# Patient Record
Sex: Male | Born: 1965
Health system: Southern US, Community
[De-identification: ages and names within clinical notes are randomized; demographics above are authoritative.]

## PROBLEM LIST (undated history)

## (undated) DIAGNOSIS — K219 Gastro-esophageal reflux disease without esophagitis: Secondary | ICD-10-CM

## (undated) DIAGNOSIS — I10 Essential (primary) hypertension: Secondary | ICD-10-CM

## (undated) DIAGNOSIS — C61 Malignant neoplasm of prostate: Secondary | ICD-10-CM

## (undated) DIAGNOSIS — G4730 Sleep apnea, unspecified: Secondary | ICD-10-CM

## (undated) DIAGNOSIS — R079 Chest pain, unspecified: Secondary | ICD-10-CM

## (undated) DIAGNOSIS — E785 Hyperlipidemia, unspecified: Secondary | ICD-10-CM

## (undated) HISTORY — PX: COLONOSCOPY: SHX174

## (undated) HISTORY — DX: Chest pain, unspecified: R07.9

## (undated) HISTORY — PX: PROSTATE BIOPSY: SHX241

---

## 1998-11-06 ENCOUNTER — Encounter: Payer: Self-pay | Admitting: Emergency Medicine

## 1998-11-06 ENCOUNTER — Emergency Department (HOSPITAL_COMMUNITY): Admission: EM | Admit: 1998-11-06 | Discharge: 1998-11-06 | Payer: Self-pay | Admitting: Emergency Medicine

## 1998-12-10 ENCOUNTER — Emergency Department (HOSPITAL_COMMUNITY): Admission: EM | Admit: 1998-12-10 | Discharge: 1998-12-10 | Payer: Self-pay | Admitting: Emergency Medicine

## 2004-05-18 ENCOUNTER — Emergency Department (HOSPITAL_COMMUNITY): Admission: EM | Admit: 2004-05-18 | Discharge: 2004-05-18 | Payer: Self-pay | Admitting: Emergency Medicine

## 2006-05-25 ENCOUNTER — Emergency Department (HOSPITAL_COMMUNITY): Admission: EM | Admit: 2006-05-25 | Discharge: 2006-05-26 | Payer: Self-pay | Admitting: Emergency Medicine

## 2007-02-19 ENCOUNTER — Encounter: Admission: RE | Admit: 2007-02-19 | Discharge: 2007-02-19 | Payer: Self-pay | Admitting: Family Medicine

## 2009-01-11 ENCOUNTER — Emergency Department (HOSPITAL_BASED_OUTPATIENT_CLINIC_OR_DEPARTMENT_OTHER): Admission: EM | Admit: 2009-01-11 | Discharge: 2009-01-11 | Payer: Self-pay | Admitting: Emergency Medicine

## 2013-02-08 ENCOUNTER — Emergency Department (INDEPENDENT_AMBULATORY_CARE_PROVIDER_SITE_OTHER)
Admission: EM | Admit: 2013-02-08 | Discharge: 2013-02-08 | Disposition: A | Discharge status: Home / Self Care | Payer: Worker's Compensation | Source: Home / Self Care | Attending: Family Medicine | Admitting: Family Medicine

## 2013-02-08 ENCOUNTER — Emergency Department (INDEPENDENT_AMBULATORY_CARE_PROVIDER_SITE_OTHER): Payer: Worker's Compensation

## 2013-02-08 ENCOUNTER — Encounter (HOSPITAL_COMMUNITY): Payer: Self-pay | Admitting: Emergency Medicine

## 2013-02-08 DIAGNOSIS — W11XXXA Fall on and from ladder, initial encounter: Secondary | ICD-10-CM

## 2013-02-08 DIAGNOSIS — R079 Chest pain, unspecified: Secondary | ICD-10-CM

## 2013-02-08 DIAGNOSIS — M549 Dorsalgia, unspecified: Secondary | ICD-10-CM

## 2013-02-08 MED ORDER — HYDROCODONE-ACETAMINOPHEN 5-325 MG PO TABS: 1.0000 | ORAL_TABLET | Freq: Four times a day (QID) | ORAL | Status: DC | PRN

## 2013-02-08 NOTE — Discharge Instructions (Signed)
Ice pack for bruises as needed and pain medicine as needed, activity as tolerated, return if further problems or go to ER for eval.

## 2013-02-08 NOTE — ED Notes (Signed)
Pt  Fell off  A  Ladder  About  6  Feet  Landed  On  Concrete   And  The  Ladder  -  He  Reports  Pain l  Shoulder  l     Elbow  And  r  Thigh  He  Reports  Pain on  Weight  Bearing  He  Did  Not black out  He  Is  Awake  And  Alert and  Sitting upright on  Exam table    Speaking in  Complete  sentances

## 2013-02-08 NOTE — ED Provider Notes (Signed)
CSN: [id number]     Arrival date & time 02/08/13  1339 History   First MD Initiated Contact with Patient 02/08/13 1612     Chief Complaint  Patient presents with  . Fall   (Consider location/radiation/quality/duration/timing/severity/associated sxs/prior Treatment) Patient is a 48 y.o. male presenting with fall. The history is provided by the patient.  Fall This is a new problem. The current episode started 6 to 12 hours ago (fell 5-6 ' when ladder broke, no head injury or abd pain, c/o left scapular and right thigh pain, ). The problem has not changed since onset.Pertinent negatives include no chest pain, no abdominal pain and no headaches.    History reviewed. No pertinent past medical history. History reviewed. No pertinent past surgical history. History reviewed. No pertinent family history. History  Substance Use Topics  . Smoking status: Never Smoker   . Smokeless tobacco: Not on file  . Alcohol Use: Yes     Comment: occasonally    Review of Systems  Constitutional: Negative.   HENT: Negative.   Respiratory: Negative.   Cardiovascular: Negative for chest pain.  Gastrointestinal: Negative.  Negative for abdominal pain.  Genitourinary: Negative.   Musculoskeletal: Positive for back pain and gait problem. Negative for joint swelling and neck pain.  Skin: Negative.  Negative for wound.  Neurological: Negative for headaches.    Allergies  Review of patient's allergies indicates no known allergies.  Home Medications   Current Outpatient Rx  Name  Route  Sig  Dispense  Refill  . HYDROcodone-acetaminophen (NORCO/VICODIN) 5-325 MG per tablet   Oral   Take 1 tablet by mouth every 6 (six) hours as needed for moderate pain.   30 tablet   0    BP 125/77  Pulse 61  Temp(Src) 97.3 F (36.3 C) (Oral)  Resp 16  SpO2 96% Physical Exam  Nursing note and vitals reviewed. Constitutional: He is oriented to person, place, and time. He appears well-developed and well-nourished.   HENT:  Head: Normocephalic and atraumatic.  Mouth/Throat: Oropharynx is clear and moist.  Eyes: Conjunctivae are normal. Pupils are equal, round, and reactive to light.  Neck: Normal range of motion. Neck supple.  Cardiovascular: Normal rate, regular rhythm, normal heart sounds and intact distal pulses.   Pulmonary/Chest: Effort normal and breath sounds normal. He exhibits no tenderness.  Abdominal: Soft. Bowel sounds are normal. There is no tenderness.  Musculoskeletal: He exhibits tenderness.       Left shoulder: He exhibits tenderness. He exhibits normal range of motion, no bony tenderness, no swelling, no spasm, normal pulse and normal strength.       Back:       Legs: Lymphadenopathy:    He has no cervical adenopathy.  Neurological: He is alert and oriented to person, place, and time. No cranial nerve deficit.  Skin: Skin is warm and dry.    ED Course  Procedures (including critical care time) Labs Review Labs Reviewed - No data to display Imaging Review Dg Ribs Unilateral W/chest Left  02/08/2013   CLINICAL DATA:  History of fall complaining of pain in the left posterior ribs.  EXAM: LEFT RIBS AND CHEST - 3+ VIEW  COMPARISON:  Chest x-ray 05/25/2006.  FINDINGS: Lung volumes are normal. No consolidative airspace disease. No pleural effusions. No pneumothorax. No pulmonary nodule or mass noted. Pulmonary vasculature and the cardiomediastinal silhouette are within normal limits.  Dedicated views of the left ribs demonstrate no acute displaced left-sided rib fractures.  IMPRESSION: 1. No  evidence of significant acute traumatic injury to the thorax.   Electronically Signed   By: Vinnie Langton M.D.   On: 02/08/2013 17:34   Dg Femur Right  02/08/2013   CLINICAL DATA:  Fall wall off ladder.  Right femur pain.  EXAM: RIGHT FEMUR - 2 VIEW  COMPARISON:  None.  FINDINGS: There is no evidence of fracture or other focal bone lesions. Soft tissues are unremarkable.  IMPRESSION: Negative.    Electronically Signed   By: Earle Gell M.D.   On: 02/08/2013 17:37    EKG Interpretation    Date/Time:    Ventricular Rate:    PR Interval:    QRS Duration:   QT Interval:    QTC Calculation:   R Axis:     Text Interpretation:              MDM  X-rays reviewed and report per radiologist.     Billy Fischer, MD 02/08/13 [phone]

## 2015-03-06 ENCOUNTER — Emergency Department (HOSPITAL_BASED_OUTPATIENT_CLINIC_OR_DEPARTMENT_OTHER)
Admission: EM | Admit: 2015-03-06 | Discharge: 2015-03-06 | Disposition: A | Discharge status: Home / Self Care | Payer: Commercial Managed Care - HMO | Attending: Emergency Medicine | Admitting: Emergency Medicine

## 2015-03-06 ENCOUNTER — Emergency Department (HOSPITAL_BASED_OUTPATIENT_CLINIC_OR_DEPARTMENT_OTHER): Payer: Commercial Managed Care - HMO

## 2015-03-06 ENCOUNTER — Encounter (HOSPITAL_BASED_OUTPATIENT_CLINIC_OR_DEPARTMENT_OTHER): Payer: Self-pay | Admitting: *Deleted

## 2015-03-06 DIAGNOSIS — R61 Generalized hyperhidrosis: Secondary | ICD-10-CM | POA: Insufficient documentation

## 2015-03-06 DIAGNOSIS — R200 Anesthesia of skin: Secondary | ICD-10-CM | POA: Diagnosis not present

## 2015-03-06 DIAGNOSIS — R079 Chest pain, unspecified: Secondary | ICD-10-CM | POA: Insufficient documentation

## 2015-03-06 DIAGNOSIS — R0602 Shortness of breath: Secondary | ICD-10-CM | POA: Insufficient documentation

## 2015-03-06 DIAGNOSIS — R202 Paresthesia of skin: Secondary | ICD-10-CM | POA: Insufficient documentation

## 2015-03-06 LAB — D-DIMER, QUANTITATIVE: D-Dimer, Quant: <0.27 ug/mL-FEU (ref 0.00–0.50)

## 2015-03-06 LAB — CBC
HCT: 42.4 % (ref 39.0–52.0)
Hemoglobin: 14.3 g/dL (ref 13.0–17.0)
MCH: 28.7 pg (ref 26.0–34.0)
MCHC: 33.7 g/dL (ref 30.0–36.0)
MCV: 85.1 fL (ref 78.0–100.0)
PLATELETS: 330 10*3/uL (ref 150–400)
RBC: 4.98 MIL/uL (ref 4.22–5.81)
RDW: 12.9 % (ref 11.5–15.5)
WBC: 11.1 10*3/uL — ABNORMAL HIGH (ref 4.0–10.5)

## 2015-03-06 LAB — BASIC METABOLIC PANEL
Anion gap: 9 (ref 5–15)
BUN: 17 mg/dL (ref 6–20)
CALCIUM: 8.8 mg/dL — AB (ref 8.9–10.3)
CO2: 25 mmol/L (ref 22–32)
Chloride: 105 mmol/L (ref 101–111)
Creatinine, Ser: 0.93 mg/dL (ref 0.61–1.24)
GFR calc non Af Amer: >60 mL/min (ref >60)
Glucose, Bld: 112 mg/dL — ABNORMAL HIGH (ref 65–99)
Potassium: 3.5 mmol/L (ref 3.5–5.1)
SODIUM: 139 mmol/L (ref 135–145)

## 2015-03-06 LAB — TROPONIN I: Troponin I: <0.03 ng/mL (ref <0.031)

## 2015-03-06 MED ORDER — ACETAMINOPHEN 500 MG PO TABS
1000.0000 mg | ORAL_TABLET | Freq: Once | ORAL | Status: AC
  Administered 2015-03-06: 1000 mg via ORAL
  Filled 2015-03-06: qty 2

## 2015-03-06 MED ORDER — NITROGLYCERIN 0.4 MG SL SUBL
0.4000 mg | SUBLINGUAL_TABLET | SUBLINGUAL | Status: DC | PRN
  Administered 2015-03-06 (×2): 0.4 mg via SUBLINGUAL
  Filled 2015-03-06: qty 1

## 2015-03-06 NOTE — ED Provider Notes (Signed)
CSN: [id number]     Arrival date & time 03/06/15  1721 History  By signing my name below, I, Altamease Oiler, attest that this documentation has been prepared under the direction and in the presence of Alfonzo Beers, MD. Electronically Signed: Altamease Oiler, ED Scribe. 03/06/2015. 5:47 PM    Chief Complaint  Patient presents with  . Chest Pain    Patient is a 50 y.o. male presenting with chest pain. The history is provided by the patient. No language interpreter was used.  Chest Pain Pain location:  Substernal area and L chest Pain quality: sharp and tightness   Pain radiates to:  Does not radiate Pain radiates to the back: no   Pain severity:  Severe Onset quality:  Gradual Duration:  2 weeks Timing:  Intermittent Progression:  Worsening Chronicity:  New Context: breathing and movement   Relieved by:  Nothing Worsened by:  Movement and deep breathing Ineffective treatments:  None tried Associated symptoms: shortness of breath   Associated symptoms: no cough, no lower extremity edema, no nausea and not vomiting   Risk factors: male sex   Risk factors: no coronary artery disease, no diabetes mellitus, no high cholesterol and no hypertension    Samuel Gonzalez is a 50 y.o. male who presents to the Emergency Department complaining of new, intermittent, sharp/tight, 8/10 in severity, cental and left-sided chest pain with onset 2 weeks ago. The pain worsened today and has been more constant. The pain has no know trigger but deep breathing and movement make it worse. He took no pain medication at home.   Associated symptoms include sweating, numbness and tingling in the bilateral upper extremities (R>L), and SOB. Pt denies nausea and LE swelling. No history of cardiac disease, HTN, DM, or high cholesterol. No recent long ravel.  He is allergic to aspirin.   History reviewed. No pertinent past medical history. History reviewed. No pertinent past surgical history. No family history on  file. Social History  Substance Use Topics  . Smoking status: Never Smoker   . Smokeless tobacco: None  . Alcohol Use: Yes     Comment: occasonally    Review of Systems  Respiratory: Positive for shortness of breath. Negative for cough.   Cardiovascular: Positive for chest pain.  Gastrointestinal: Negative for nausea and vomiting.  All other systems reviewed and are negative.  Allergies  Aspirin  Home Medications   Prior to Admission medications   Medication Sig Start Date End Date Taking? Authorizing Provider  HYDROcodone-acetaminophen (NORCO/VICODIN) 5-325 MG per tablet Take 1 tablet by mouth every 6 (six) hours as needed for moderate pain. 02/08/13   Billy Fischer, MD   BP 147/78 mmHg  Pulse 61  Temp(Src) 97.8 F (36.6 C) (Oral)  Resp 18  Ht 5\' 10"  (1.778 m)  Wt 260 lb (117.935 kg)  BMI 37.31 kg/m2  SpO2 97% Vitals reviewed Physical Exam  Physical Examination: General appearance - alert, well appearing, and in no distress Mental status - alert, oriented to person, place, and time Eyes - no conjunctival injection, no scleral icterus Mouth - mucous membranes moist, pharynx normal without lesions Chest - clear to auscultation, no wheezes, rales or rhonchi, symmetric air entry Heart - normal rate, regular rhythm, normal S1, S2, no murmurs, rubs, clicks or gallops Abdomen - soft, nontender, nondistended, no masses or organomegaly Neurological - alert, oriented, normal speech, strength 5/5 in extremities x 4, sensation intact Extremities - peripheral pulses normal, no pedal edema, no clubbing or cyanosis  Skin - normal coloration and turgor, no rashes  ED Course  Procedures (including critical care time) DIAGNOSTIC STUDIES: Oxygen Saturation is 97% on RA,  normal by my interpretation.    COORDINATION OF CARE: 5:43 PM Discussed treatment plan which includes lab work, CXR, EKG with pt at bedside and pt agreed to plan.  Labs Review Labs Reviewed  BASIC METABOLIC PANEL -  Abnormal; Notable for the following:    Glucose, Bld 112 (*)    Calcium 8.8 (*)    All other components within normal limits  CBC - Abnormal; Notable for the following:    WBC 11.1 (*)    All other components within normal limits  TROPONIN I  D-DIMER, QUANTITATIVE (NOT AT [facility])  TROPONIN I    Imaging Review Dg Chest 2 View  03/06/2015  CLINICAL DATA:  Chest tightness ongoing for 2 weeks. EXAM: CHEST  2 VIEW COMPARISON:  02/08/2013. FINDINGS: The lungs are clear wiithout focal pneumonia, edema, pneumothorax or pleural effusion. The cardiopericardial silhouette is within normal limits for size. The visualized bony structures of the thorax are intact. Telemetry leads overlie the chest. IMPRESSION: No active cardiopulmonary disease. Electronically Signed   By: Misty Stanley M.D.   On: 03/06/2015 17:53   I have personally reviewed and evaluated these images and lab results as part of my medical decision-making.   EKG Interpretation   Date/Time:  Saturday March 06 2015 17:29:56 EST Ventricular Rate:  64 PR Interval:  151 QRS Duration: 95 QT Interval:  436 QTC Calculation: 450 R Axis:   42 Text Interpretation:  Sinus rhythm No significant change since last  tracing Confirmed by [facility]  MD, Chaniqua Brisby [phone]) on 03/06/2015 6:02:13 PM      MDM   Final diagnoses:  Chest pain, unspecified chest pain type    Pt presenting with c/o chest pain which has been intermittent over the past 2 weeks and also constant > 8 hours today.  Workup reassuring including EKG, 2 sets of troponin, CXR.  D-dimer checked and negative as well.   He has a heart score of 1. Discussed results with patient and he is agreeable with plan for discharge and outpatient followup.  Discharged with strict return precautions.  Pt agreeable with plan.  I personally performed the services described in this documentation, which was scribed in my presence. The recorded information has been reviewed and is accurate.     Alfonzo Beers, MD 03/06/15 2206

## 2015-03-06 NOTE — ED Notes (Signed)
Patient is alert and oriented x4.  He is complaining of chest tightness that has been ongoing for 2 weeks. Patient adds that he also have been having numbness in both arms.  Currently he is rating his discomfort 8 of 10.  He has been having this problem intermittently for 6 months

## 2015-03-06 NOTE — Discharge Instructions (Signed)
Return to the ED with any concerns including worsening pain, difficulty breathing, fainting, vomiting and not able to keep down liquids, decreased level of alertness/lethargy, or any other alarming symptoms

## 2015-03-06 NOTE — ED Notes (Signed)
Per pt report tingling in all finger to lt hand, ache in back of upper arm and in teeth. Feeling HA due to NTG x 2 given. Report chest pressure improved.

## 2015-03-06 NOTE — ED Notes (Signed)
Pt placed on auto vitals Q30.  

## 2015-03-25 ENCOUNTER — Ambulatory Visit (INDEPENDENT_AMBULATORY_CARE_PROVIDER_SITE_OTHER): Payer: Commercial Managed Care - HMO | Admitting: Neurology

## 2015-03-25 ENCOUNTER — Encounter: Payer: Self-pay | Admitting: Neurology

## 2015-03-25 VITALS — BP 125/80 | HR 58 | Resp 18 | Ht 71.0 in | Wt 266.0 lb

## 2015-03-25 DIAGNOSIS — R0683 Snoring: Secondary | ICD-10-CM | POA: Diagnosis not present

## 2015-03-25 DIAGNOSIS — G4719 Other hypersomnia: Secondary | ICD-10-CM

## 2015-03-25 DIAGNOSIS — R079 Chest pain, unspecified: Secondary | ICD-10-CM

## 2015-03-25 DIAGNOSIS — G4726 Circadian rhythm sleep disorder, shift work type: Secondary | ICD-10-CM | POA: Diagnosis not present

## 2015-03-25 DIAGNOSIS — R51 Headache: Secondary | ICD-10-CM

## 2015-03-25 DIAGNOSIS — R519 Headache, unspecified: Secondary | ICD-10-CM

## 2015-03-25 DIAGNOSIS — E669 Obesity, unspecified: Secondary | ICD-10-CM | POA: Diagnosis not present

## 2015-03-25 NOTE — Patient Instructions (Addendum)

## 2015-03-25 NOTE — Progress Notes (Signed)
Subjective:    Patient ID: Samuel Gonzalez is a 50 y.o. male.  HPI     Star Age, MD, PhD [facility] [address]O. [place], [place] [address]   Dear Shawna Orleans and Ulice Dash,   I saw your patient, Samuel Gonzalez, upon your kind request in my neurologic clinic today for initial consultation of his sleep disorder, in particular, concern for underlying obstructive sleep apnea. The patient is unaccompanied today. As you know, Samuel Gonzalez is a 50 year old right-handed gentleman with an underlying medical history of chest pain, shift work, and obesity, who reports snoring and excessive daytime somnolence.   He has worked 3rd shift for years, had a gap for 2 years when he worked days and is back on 3rd shift for the past 6 months. He works in Scient[employer], clinical (histocompatibility and immunogenetics). He works from midnight to 8:30 AM. Bedtime is around 1 to 2 PM and rise time is usually around 8 PM. He lives at home with his wife, one daughter and one granddaughter. He is a nonsmoker. He drinks alcohol infrequently, 2-3 times a month, and caffeine in the form of cola, 2-3 cans per day. He denies restless leg symptoms or nocturia. He denies a family history of OSA. His wife has noted abnormal sounds while he is asleep including snorting. He has occasional morning headaches. He had a cardiac SPECT scan on 03/19/2015 and an echocardiogram on 03/24/2015 with results pending. He has an appointment in your office later on this month. His EKG on 03/11/2015 was reportedly normal per your records. He was started on metoprolol and baby aspirin recently. He was also started on atorvastatin. He had blood work through your office with results pending. He was given a prescription for nitroglycerin which he has not used yet. He denies reflux symptoms at night. His Epworth sleepiness score is 10 out of 24 today, his fatigue score is 22 out of 63. I reviewed your office note from 03/11/2015, which you kindly  included.  His Past Medical History Is Significant For: Past Medical History  Diagnosis Date  . Chest pain     His Past Surgical History Is Significant For: No past surgical history on file.  His Family History Is Significant For: Family History  Problem Relation Age of Onset  . Diabetes Mother   . Hypertension Mother   . Heart disease Maternal Grandmother   . Heart disease Maternal Grandfather   . Cancer Paternal Grandmother   . Cancer Paternal Grandfather     His Social History Is Significant For: Social History   Social History  . Marital Status: Married    Spouse Name: N/A  . Number of Children: 2  . Years of Education: [employer]   Social History Main Topics  . Smoking status: Never Smoker   . Smokeless tobacco: None  . Alcohol Use: 0.0 oz/week    0 Standard drinks or equivalent per week  . Drug Use: No  . Sexual Activity: Not Asked   Other Topics Concern  . None   Social History Narrative   Drinks 2-3 cokes a day     His Allergies Are:  Allergies  Allergen Reactions  . Aspirin Hives  :   His Current Medications Are:  Outpatient Encounter Prescriptions as of 03/25/2015  Medication Sig  . aspirin 81 MG tablet Take 81 mg by mouth daily.  Marland Kitchen atorvastatin (LIPITOR) 40 MG tablet TK 1 T PO QD  . metoprolol succinate (TOPROL-XL) 25 MG 24 hr  tablet Take 25 mg by mouth daily.  . nitroGLYCERIN (NITROSTAT) 0.4 MG SL tablet 1 (ONE) TABLET UNDER TONGUE EVERY MINUTES AS NEEDED FOR CHEST PAIN  . [DISCONTINUED] HYDROcodone-acetaminophen (NORCO/VICODIN) 5-325 MG per tablet Take 1 tablet by mouth every 6 (six) hours as needed for moderate pain.   No facility-administered encounter medications on file as of 03/25/2015.  :  Review of Systems:  Out of a complete 14 point review of systems, all are reviewed and negative with the exception of these symptoms as listed below:   Review of Systems  Neurological:       Patient has sleep study 10 years ago, never prescribed  CPAP.  Patient works third shift, sometimes has trouble staying asleep, snoring, wakes up with dry mouth, sometimes gets headaches, sometimes wakes up feeling tired.    Epworth Sleepiness Scale 0= would never doze 1= slight chance of dozing 2= moderate chance of dozing 3= high chance of dozing  Sitting and reading:2 Watching TV:2 Sitting inactive in a public place (ex. Theater or meeting):1 As a passenger in a car for an hour without a break:0 Lying down to rest in the afternoon:3 Sitting and talking to someone:1 Sitting quietly after lunch (no alcohol):1 In a car, while stopped in traffic:0 Total:10   Objective:  Neurologic Exam  Physical Exam Physical Examination:   Filed Vitals:   03/25/15 0855  BP: 125/80  Pulse: 58  Resp: 18    General Examination: The patient is a very pleasant 50 y.o. male in no acute distress. He appears well-developed and well-nourished and well groomed.   HEENT: Normocephalic, atraumatic, pupils are equal, round and reactive to light and accommodation. Funduscopic exam is normal with sharp disc margins noted. Extraocular tracking is good without limitation to gaze excursion or nystagmus noted. Normal smooth pursuit is noted. Hearing is grossly intact. Tympanic membranes are clear bilaterally. Face is symmetric with normal facial animation and normal facial sensation. Speech is clear with no dysarthria noted. There is no hypophonia. There is no lip, neck/head, jaw or voice tremor. Neck is supple with full range of passive and active motion. There are no carotid bruits on auscultation. Oropharynx exam reveals: mild mouth dryness, adequate dental hygiene and marked airway crowding, due to narrow airway entry, redundant and thicker soft palate and large uvula and tonsils of 2+ in place, thicker tongue. Mallampati is class III. Tongue protrudes centrally and palate elevates symmetrically. Tonsils are 2+ in size. Neck size is 18.75 inches. He has a Moderate  overbite.   Chest: Clear to auscultation without wheezing, rhonchi or crackles noted.  Heart: S1+S2+0, regular and normal without murmurs, rubs or gallops noted.   Abdomen: Soft, non-tender and non-distended with normal bowel sounds appreciated on auscultation.  Extremities: There is no pitting edema in the distal lower extremities bilaterally. Pedal pulses are intact.  Skin: Warm and dry without trophic changes noted. There are no varicose veins.  Musculoskeletal: exam reveals no obvious joint deformities, tenderness or joint swelling or erythema.   Neurologically:  Mental status: The patient is awake, alert and oriented in all 4 spheres. His immediate and remote memory, attention, language skills and fund of knowledge are appropriate. There is no evidence of aphasia, agnosia, apraxia or anomia. Speech is clear with normal prosody and enunciation. Thought process is linear. Mood is normal and affect is normal.  Cranial nerves II - XII are as described above under HEENT exam. In addition: shoulder shrug is normal with equal shoulder height noted. Motor  exam: Normal bulk, strength and tone is noted. There is no drift, tremor or rebound. Romberg is negative. Reflexes are 2+ throughout. Babinski: Toes are flexor bilaterally. Fine motor skills and coordination: intact with normal finger taps, normal hand movements, normal rapid alternating patting, normal foot taps and normal foot agility.  Cerebellar testing: No dysmetria or intention tremor on finger to nose testing. Heel to shin is unremarkable bilaterally. There is no truncal or gait ataxia.  Sensory exam: intact to light touch, pinprick, vibration, temperature sense in the upper and lower extremities.  Gait, station and balance: He stands easily. No veering to one side is noted. No leaning to one side is noted. Posture is age-appropriate and stance is narrow based. Gait shows normal stride length and normal pace. No problems turning are noted.  He turns en bloc. Tandem walk is unremarkable.   Assessment and Plan:   In summary, Samuel Gonzalez is a very pleasant 50 y.o.-year old male with an underlying medical history of chest pain, shift work, and obesity, whose history and physical exam are highly concerning for obstructive sleep apnea (OSA). His shift work makes it more difficult for him to maintain adequate sleep. Of note, on Friday nights he tries to sleep at night. We will try to schedule him for a Friday night sleep study if possible. I had a long chat with the patient about my findings and the diagnosis of OSA, its prognosis and treatment options. We talked about medical treatments, surgical interventions and non-pharmacological approaches. I explained in particular the risks and ramifications of untreated moderate to severe OSA, especially with respect to developing cardiovascular disease down the Road, including congestive heart failure, difficult to treat hypertension, cardiac arrhythmias, or stroke. Even type 2 diabetes has, in part, been linked to untreated OSA. Symptoms of untreated OSA include daytime sleepiness, memory problems, mood irritability and mood disorder such as depression and anxiety, lack of energy, as well as recurrent headaches, especially morning headaches. We talked about trying to maintain a healthy lifestyle in general, as well as the importance of weight control. I encouraged the patient to eat healthy, exercise daily and keep well hydrated, to keep a scheduled bedtime and wake time routine, to not skip any meals and eat healthy snacks in between meals. I advised the patient not to drive when feeling sleepy. I recommended the following at this time: sleep study with potential positive airway pressure titration. (We will score hypopneas at 4% and split the sleep study into diagnostic and treatment portion, if the estimated. 2 hour AHI is >20/h).   I explained the sleep test procedure to the patient and also outlined  possible surgical and non-surgical treatment options of OSA, including the use of a custom-made dental device (which would require a referral to a specialist dentist or oral surgeon), upper airway surgical options, such as pillar implants, radiofrequency surgery, tongue base surgery, and UPPP (which would involve a referral to an ENT surgeon). Rarely, jaw surgery such as mandibular advancement may be considered.  I also explained the CPAP treatment option to the patient, who indicated that he would be willing to try CPAP if the need arises. I explained the importance of being compliant with PAP treatment, not only for insurance purposes but primarily to improve His symptoms, and for the patient's long term health benefit, including to reduce His cardiovascular risks. I answered all his questions today and the patient was in agreement. I would like to see him back after the sleep study  is completed and encouraged him to call with any interim questions, concerns, problems or updates.   Thank you very much for allowing me to participate in the care of this nice patient. If I can be of any further assistance to you please do not hesitate to call me at [phone].  Sincerely,   Star Age, MD, PhD

## 2015-04-12 ENCOUNTER — Ambulatory Visit: Payer: Self-pay | Admitting: Cardiovascular Disease

## 2015-04-16 ENCOUNTER — Ambulatory Visit (INDEPENDENT_AMBULATORY_CARE_PROVIDER_SITE_OTHER): Payer: Commercial Managed Care - HMO | Admitting: Neurology

## 2015-04-16 DIAGNOSIS — G4733 Obstructive sleep apnea (adult) (pediatric): Secondary | ICD-10-CM | POA: Diagnosis not present

## 2015-04-16 DIAGNOSIS — G479 Sleep disorder, unspecified: Secondary | ICD-10-CM

## 2015-04-16 DIAGNOSIS — G4761 Periodic limb movement disorder: Secondary | ICD-10-CM

## 2015-04-16 DIAGNOSIS — G4734 Idiopathic sleep related nonobstructive alveolar hypoventilation: Secondary | ICD-10-CM

## 2015-04-16 NOTE — Sleep Study (Signed)
Please see the scanned sleep study interpretation located in the procedure tab within the chart review section.   

## 2015-04-18 DIAGNOSIS — R079 Chest pain, unspecified: Secondary | ICD-10-CM | POA: Diagnosis present

## 2015-04-18 NOTE — H&P (Signed)
OFFICE VISIT NOTES COPIED TO EPIC FOR DOCUMENTATION  . Samuel Gonzalez 2015-04-12 8:08 AM Location: [facility] Patient #: [id number] DOB: [birth year] Married / Language: Samuel Gonzalez / Race: White Male   History of Present Illness [name] AGNP-C; 2015-04-12 10:23 AM) The patient is a 50 year old male who presents for a follow-up for Chest pain. He presented for evaluation of recurrent episodes of chest pain. He described pain as a squeezing sensation in his left arm and heaviness in his chest. On 03/06/2015, he went to the emergency department after he developed symptoms that would not resolve with rest and was given sublingual nitroglycerin with relief in pain. ACS was ruled out and he was referred back to his PCP for further management. He has continued to have intermittent episodes of chest pain, both at rest and with exertion, however states exertion certainly makes his symptoms worse. Reports associated mild shortness of breath.  He was scheduled for echocardiogram and nuclear stress test and presents today for follow-up. He was also started on metoprolol, atorvastatin, aspirin, and given sublingual nitroglycerin for symptoms suggestive of angina. Since adding medications, his symptoms have improved somewhat but have remained persistent.   Problem List/Past Medical ([name]; 04/12/2015 9:24 AM) Angina pectoris (I20.9) Exercise sestamibi stress test 03/19/2015: 1. The resting electrocardiogram demonstrated normal sinus rhythm, normal resting conduction and no resting arrhythmias. The stress electrocardiogram was normal. PVC noted in recovery. The patient performed treadmill exercise using a Bruce protocol, completing 5:17 minutes. The patient completed an estimated workload of 7.05 METS 85% of the maximum predicted heart rate. Stress symptoms included dyspnea. The stress test was terminated because of fatigue. Decreased exercise tolerence. 2. The left ventricle  appears to be dilated both in rest and stress images at 145 mL. The perfusion imaging study demonstrates a small sized mid to distal anterior and anteroapical thinning without definite evidence of ischemia. Left ventricular systolic function was mildly depressed at 49%. This represents an intermediate risk study due to LV dilatation and mildly reduced LVEF. Clinical correlation recommended Other fatigue (R53.83) Neurology office visit 03/25/2015: Evaluation for sleep apnea; scheduled for sleep study with potential positive airway pressure titration. Obesity (BMI 30-39.9) (E66.9) Dyspnea on exertion (R06.09) Echocardiogram 03/24/2015: Left ventricle cavity is normal in size. Mild concentric hypertrophy of the left ventricle. Normal global wall motion. Calculated EF 66%. Left atrial cavity is mildly dilated. Trace tricuspid regurgitation. No evidence of pulmonary hypertension. Hypercholesteremia (E78.00)  Allergies ([name]; Apr 12, 2015 9:27 AM) Aspirin *ANALGESICS - NonNarcotic* ok on 81mg  ASA  Family History ([name]; 04/12/15 9:28 AM) Mother In stable health. DM, HTN; no heart attacks or strokes; no other known cardiovascular conditions Father In good health. no heart attacks or strokes, no cardiovascular conditions Sister 1 Younger Brother 1 Younger  Social History ([name]; 04/12/15 9:24 AM) Marital status Married. Living Situation Lives with spouse. Number of Children 2. Alcohol Use Occasional alcohol use. Current tobacco use Never smoker.  Past Surgical History ([name]; 2015/04/12 9:24 AM) None02/03/2015  Medication History ([name]; 12-Apr-2015 9:28 AM) Atorvastatin Calcium (40MG  Tablet, 1 Tablet Oral daily, Taken starting 03/16/2015) Active. Aspirin EC Low Dose (81MG  Tablet DR, 1 (one) Tablet Oral daily, Taken starting 03/11/2015) Active. Nitrostat (0.4MG  Tab Sublingual, 1 (one) Tablet Sublingual every minutes as needed for chest  pain, Taken starting 03/11/2015) Active. Metoprolol Succinate ER (25MG  Tablet ER 24HR, 1 (one) Tablet Oral daily, Taken starting 03/11/2015) Active. Medications Reconciled  Diagnostic Studies History ([name]; 2015/04/12 9:25 AM)  Echocardiogram02/15/2017 Left ventricle cavity is normal in size. Mild concentric hypertrophy of the left ventricle. Normal global wall motion. Calculated EF 66%. Left atrial cavity is mildly dilated. Trace tricuspid regurgitation. No evidence of pulmonary hypertension Nuclear stress test02/11/2015 1. The resting electrocardiogram demonstrated normal sinus rhythm, normal resting conduction and no resting arrhythmias. The stress electrocardiogram was normal. PVC noted in recovery. The patient performed treadmill exercise using a Bruce protocol, completing 5:17 minutes. The patient completed an estimated workload of 7.05 METS 85% of the maximum predicted heart rate. Stress symptoms included dyspnea. The stress test was terminated because of fatigue. Decreased exercise tolerence. 2. The left ventricle appears to be dilated both in rest and stress images at 145 mL. The perfusion imaging study demonstrates a small sized mid to distal anterior and anteroapical thinning without definite evidence of ischemia. Left ventricular systolic function was mildly depressed at 49%. This represents an intermediate risk study due to LV dilatation and mildly reduced LVEF. Clinical correlation recommended Labwork 03/11/2015: Total cholesterol 252, triglycerides 224, HDL 32, LDL 160, LDL particle # [id number], LP-IR score 82, TSH 2.95, creatinine 0.91, potassium 4.6, CMP normal 03/06/2015: CBC normal, troponin negative 2, d-dimer negative, creatinine 0.93, BMP normal Sleep Study2007 Treadmill stress test2007 Colonoscopy2015 Chest X-ray01/28/2017 No active cardiopulmonary disease    Review of Systems [facility] Ebony Hail, AGNP-C; 04/02/2015 10:35 AM) General Not Present- Anorexia, Fatigue and  Fever. Respiratory Not Present- Cough, Decreased Exercise Tolerance, Difficulty Breathing on Exertion and Dyspnea. Cardiovascular Present- Chest Pain. Not Present- Claudications, Edema, Orthopnea, Palpitations and Paroxysmal Nocturnal Dyspnea. Gastrointestinal Not Present- Black, Tarry Stool, Change in Bowel Habits and Nausea. Neurological Not Present- Focal Neurological Symptoms and Syncope. Endocrine Not Present- Cold Intolerance, Excessive Sweating, Heat Intolerance and Thyroid Problems. Hematology Not Present- Anemia, Easy Bruising, Petechiae and Prolonged Bleeding.  Vitals ([name]; 04/02/2015 9:31 AM) 04/02/2015 9:25 AM Weight: 272.06 lb Height: 71in Body Surface Area: 2.4 m Body Mass Index: 37.94 kg/m  Pulse: 58 (Regular)  P.OX: 98% (Room air) BP: 132/52 (Sitting, Left Arm, Standard)  General appearance: alert, cooperative, appears stated age and no distress Eyes: negative findings: lids and lashes normal Neck: no adenopathy, no carotid bruit, no JVD, supple, symmetrical, trachea midline and thyroid not enlarged, symmetric, no tenderness/mass/nodules Neck: JVP - normal, carotids 2+= without bruits Resp: clear to auscultation bilaterally Chest wall: no tenderness Cardio: regular rate and rhythm, S1, S2 normal, no murmur, click, rub or gallop GI: soft, non-tender; bowel sounds normal; no masses,  no organomegaly Extremities: extremities normal, atraumatic, no cyanosis or edema Pulses: 2+ and symmetric Skin: Skin color, texture, turgor normal. No rashes or lesions Neurologic: Grossly normal  Assessment & Plan (Bridgette Allison AGNP-C; 04/02/2015 10:34 AM)  Angina pectoris (I20.9) Story: Exercise sestamibi stress test 03/19/2015: 1. The resting electrocardiogram demonstrated normal sinus rhythm, normal resting conduction and no resting arrhythmias. The stress electrocardiogram was normal. PVC noted in recovery. The patient performed treadmill exercise using a Bruce  protocol, completing 5:17 minutes. The patient completed an estimated workload of 7.05 METS 85% of the maximum predicted heart rate. Stress symptoms included dyspnea. The stress test was terminated because of fatigue. Decreased exercise tolerence. 2. The left ventricle appears to be dilated both in rest and stress images at 145 mL. The perfusion imaging study demonstrates a small sized mid to distal anterior and anteroapical thinning without definite evidence of ischemia. Left ventricular systolic function was mildly depressed at 49%. This represents an intermediate risk study due to LV dilatation and mildly reduced LVEF.  Clinical correlation recommended Impression: EKG 03/11/2015; normal sinus rhythm at 60 bpm, normal axis. No evidence of ischemia, normal EKG. Current Plans Started Isosorbide Mononitrate ER 30MG , 1 (one) Tablet daily, #30, 30 days starting 04/02/2015, Ref. x3. Future Plans [id number]: METABOLIC PANEL, BASIC (99991111) - one time 04/12/2015: CBC & PLATELETS (AUTO) [id number]) - one time 04/12/2015: PT (PROTHROMBIN TIME) ([id number]) - one time  Dyspnea on exertion (R06.09) Story: Echocardiogram 03/24/2015: Left ventricle cavity is normal in size. Mild concentric hypertrophy of the left ventricle. Normal global wall motion. Calculated EF 66%. Left atrial cavity is mildly dilated. Trace tricuspid regurgitation. No evidence of pulmonary hypertension.  Other fatigue (R53.83) Story: Neurology office visit 03/25/2015: Evaluation for sleep apnea; scheduled for sleep study with potential positive airway pressure titration.  Mixed hyperlipidemia (E78.2) Current Plans Changed Atorvastatin Calcium 80MG , 1 Tablet daily, #30, 30 days starting 04/02/2015, No Refill. Obesity (BMI 30-39.9) (E66.9)  Current Plans Mechanism of underlying disease process and action of medications discussed with the patient. I discussed primary/secondary prevention and also dietary counseling was done. He presents for follow-up  of echocardiogram and nuclear stress test for symptoms consistent with angina. Echocardiogram revealed only mild LVH with normal LVEF. Nuclear stress test was intermediate risk with anteroapical thinning with LV dilation and reduced poststress EF at 49%. Despite medical therapy, patient has continued to have symptoms. Schedule for cardiac catheterization, and possible angioplasty. We discussed regarding risks, benefits, alternatives to this including CTA and continued medical therapy. Patient wants to proceed. Understands <1-2% risk of death, stroke, MI, urgent CABG, bleeding, infection, renal failure but not limited to these. Due to markedly elevated lipids, I have increased Lipitor to 80 mg. I have also added Imdur 30 mg for improved symptoms control. Follow up after procedure for reevaluation and further recommendations.  *I have discussed this case with Dr. Einar Gip and he personally examined the patient and participated in formulating the plan.*  Addendum Note(Bridgette Allison AGNP-C; 04/14/2015 3:57 PM) 04/14/2015: Creatinine 0.83, potassium 4.2, CBC normal, PT/INR normal  labs stable to proceed with coronary angiogram.  Signed by [name], AGNP-C (04/02/2015 10:35 AM)

## 2015-04-20 ENCOUNTER — Encounter (HOSPITAL_COMMUNITY): Payer: Self-pay | Admitting: Cardiology

## 2015-04-20 ENCOUNTER — Ambulatory Visit (HOSPITAL_COMMUNITY)
Admission: RE | Admit: 2015-04-20 | Discharge: 2015-04-20 | Disposition: A | Discharge status: Home / Self Care | Payer: Commercial Managed Care - HMO | Source: Ambulatory Visit | Attending: Cardiology | Admitting: Cardiology

## 2015-04-20 ENCOUNTER — Encounter (HOSPITAL_COMMUNITY)
Admission: RE | Disposition: A | Discharge status: Home / Self Care | Payer: Self-pay | Source: Ambulatory Visit | Attending: Cardiology

## 2015-04-20 ENCOUNTER — Telehealth: Payer: Self-pay | Admitting: Neurology

## 2015-04-20 DIAGNOSIS — I209 Angina pectoris, unspecified: Secondary | ICD-10-CM | POA: Insufficient documentation

## 2015-04-20 DIAGNOSIS — R079 Chest pain, unspecified: Secondary | ICD-10-CM | POA: Diagnosis present

## 2015-04-20 DIAGNOSIS — R0609 Other forms of dyspnea: Secondary | ICD-10-CM | POA: Diagnosis not present

## 2015-04-20 DIAGNOSIS — Z7982 Long term (current) use of aspirin: Secondary | ICD-10-CM | POA: Diagnosis not present

## 2015-04-20 DIAGNOSIS — R072 Precordial pain: Secondary | ICD-10-CM | POA: Diagnosis present

## 2015-04-20 DIAGNOSIS — G4733 Obstructive sleep apnea (adult) (pediatric): Secondary | ICD-10-CM

## 2015-04-20 DIAGNOSIS — G4734 Idiopathic sleep related nonobstructive alveolar hypoventilation: Secondary | ICD-10-CM

## 2015-04-20 DIAGNOSIS — Z6837 Body mass index (BMI) 37.0-37.9, adult: Secondary | ICD-10-CM | POA: Diagnosis not present

## 2015-04-20 DIAGNOSIS — E785 Hyperlipidemia, unspecified: Secondary | ICD-10-CM | POA: Insufficient documentation

## 2015-04-20 DIAGNOSIS — E782 Mixed hyperlipidemia: Secondary | ICD-10-CM | POA: Diagnosis not present

## 2015-04-20 DIAGNOSIS — G479 Sleep disorder, unspecified: Secondary | ICD-10-CM

## 2015-04-20 DIAGNOSIS — G4761 Periodic limb movement disorder: Secondary | ICD-10-CM

## 2015-04-20 HISTORY — PX: CARDIAC CATHETERIZATION: SHX172

## 2015-04-20 SURGERY — LEFT HEART CATH AND CORONARY ANGIOGRAPHY
Anesthesia: LOCAL

## 2015-04-20 MED ORDER — FENTANYL CITRATE (PF) 100 MCG/2ML IJ SOLN
INTRAMUSCULAR | Status: AC
Start: 2015-04-20 — End: 2015-04-20
  Filled 2015-04-20: qty 2

## 2015-04-20 MED ORDER — VERAPAMIL HCL 2.5 MG/ML IV SOLN
INTRAVENOUS | Status: AC
  Filled 2015-04-20: qty 2

## 2015-04-20 MED ORDER — NITROGLYCERIN 0.4 MG SL SUBL
0.4000 mg | SUBLINGUAL_TABLET | SUBLINGUAL | Status: DC | PRN
  Administered 2015-04-20: 0.4 mg via SUBLINGUAL
  Filled 2015-04-20: qty 25

## 2015-04-20 MED ORDER — LIDOCAINE HCL (PF) 1 % IJ SOLN
INTRAMUSCULAR | Status: DC | PRN
  Administered 2015-04-20: 5 mL

## 2015-04-20 MED ORDER — HEPARIN SODIUM (PORCINE) 1000 UNIT/ML IJ SOLN
INTRAMUSCULAR | Status: DC | PRN
  Administered 2015-04-20: 6000 [IU] via INTRAVENOUS

## 2015-04-20 MED ORDER — SODIUM CHLORIDE 0.9% FLUSH: 3.0000 mL | Freq: Two times a day (BID) | INTRAVENOUS | Status: DC

## 2015-04-20 MED ORDER — SODIUM CHLORIDE 0.9% FLUSH: 3.0000 mL | INTRAVENOUS | Status: DC | PRN

## 2015-04-20 MED ORDER — IOHEXOL 350 MG/ML SOLN
INTRAVENOUS | Status: DC | PRN
  Administered 2015-04-20: 50 mL via INTRA_ARTERIAL

## 2015-04-20 MED ORDER — ASPIRIN 81 MG PO CHEW
CHEWABLE_TABLET | ORAL | Status: AC
  Filled 2015-04-20: qty 1

## 2015-04-20 MED ORDER — NITROGLYCERIN 0.4 MG SL SUBL
SUBLINGUAL_TABLET | SUBLINGUAL | Status: AC
  Filled 2015-04-20: qty 1

## 2015-04-20 MED ORDER — MIDAZOLAM HCL 2 MG/2ML IJ SOLN
INTRAMUSCULAR | Status: AC
  Filled 2015-04-20: qty 2

## 2015-04-20 MED ORDER — NITROGLYCERIN 1 MG/10 ML FOR IR/CATH LAB
INTRA_ARTERIAL | Status: DC | PRN
  Administered 2015-04-20 (×2): 200 ug via INTRACORONARY

## 2015-04-20 MED ORDER — HEPARIN (PORCINE) IN NACL 2-0.9 UNIT/ML-% IJ SOLN
INTRAMUSCULAR | Status: DC | PRN
  Administered 2015-04-20: 10:00:00

## 2015-04-20 MED ORDER — LIDOCAINE HCL (PF) 1 % IJ SOLN
INTRAMUSCULAR | Status: AC
  Filled 2015-04-20: qty 30

## 2015-04-20 MED ORDER — FENTANYL CITRATE (PF) 100 MCG/2ML IJ SOLN
INTRAMUSCULAR | Status: DC | PRN
  Administered 2015-04-20 (×2): 50 ug via INTRAVENOUS

## 2015-04-20 MED ORDER — FAMOTIDINE 40 MG PO TABS: 40.0000 mg | ORAL_TABLET | Freq: Two times a day (BID) | ORAL | Status: DC

## 2015-04-20 MED ORDER — SODIUM CHLORIDE 0.9 % IV SOLN: 250.0000 mL | INTRAVENOUS | Status: DC | PRN

## 2015-04-20 MED ORDER — SODIUM CHLORIDE 0.9 % WEIGHT BASED INFUSION
3.0000 mL/kg/h | INTRAVENOUS | Status: DC
  Administered 2015-04-20: 3 mL/kg/h via INTRAVENOUS

## 2015-04-20 MED ORDER — HEPARIN (PORCINE) IN NACL 2-0.9 UNIT/ML-% IJ SOLN
INTRAMUSCULAR | Status: AC
  Filled 2015-04-20: qty 1000

## 2015-04-20 MED ORDER — SODIUM CHLORIDE 0.9 % WEIGHT BASED INFUSION
1.0000 mL/kg/h | INTRAVENOUS | Status: DC
Start: 2015-04-20 — End: 2015-04-20

## 2015-04-20 MED ORDER — HEPARIN SODIUM (PORCINE) 1000 UNIT/ML IJ SOLN
INTRAMUSCULAR | Status: AC
  Filled 2015-04-20: qty 1

## 2015-04-20 MED ORDER — SODIUM CHLORIDE 0.9 % WEIGHT BASED INFUSION: 1.0000 mL/kg/h | INTRAVENOUS | Status: DC

## 2015-04-20 MED ORDER — ASPIRIN 81 MG PO CHEW
81.0000 mg | CHEWABLE_TABLET | ORAL | Status: AC
  Administered 2015-04-20: 81 mg via ORAL

## 2015-04-20 MED ORDER — VERAPAMIL HCL 2.5 MG/ML IV SOLN
INTRA_ARTERIAL | Status: DC | PRN
  Administered 2015-04-20: 10 mL via INTRA_ARTERIAL

## 2015-04-20 MED ORDER — MIDAZOLAM HCL 2 MG/2ML IJ SOLN
INTRAMUSCULAR | Status: DC | PRN
  Administered 2015-04-20: 2 mg via INTRAVENOUS

## 2015-04-20 SURGICAL SUPPLY — 8 items
CATH OPTITORQUE JACKY 4.0 5F (CATHETERS) ×1 IMPLANT
DEVICE RAD COMP TR BAND LRG (VASCULAR PRODUCTS) ×2 IMPLANT
GLIDESHEATH SLEND A-KIT 6F 20G (SHEATH) ×1 IMPLANT
KIT HEART LEFT (KITS) ×2 IMPLANT
PACK CARDIAC CATHETERIZATION (CUSTOM PROCEDURE TRAY) ×2 IMPLANT
TRANSDUCER W/STOPCOCK (MISCELLANEOUS) ×2 IMPLANT
TUBING CIL FLEX 10 FLL-RA (TUBING) ×2 IMPLANT
WIRE SAFE-T 1.5MM-J .035X260CM (WIRE) ×1 IMPLANT

## 2015-04-20 NOTE — Telephone Encounter (Signed)
Patient referred by Dr. Einar Gip, seen by me on 03/25/15, diagnostic PSG on 04/16/15, ins: [employer].   Please call and notify the patient that the recent sleep study did confirm the diagnosis of obstructive sleep apnea and that I recommend treatment for this in the form of CPAP. This will require a repeat sleep study for proper titration and mask fitting. Please explain to patient and arrange for a CPAP titration study. I have placed an order in the chart. Thanks, and please route to [facility] for scheduling next sleep study.  Star Age, MD, PhD [facility])

## 2015-04-20 NOTE — Discharge Instructions (Signed)
Angiogram, Care After °Refer to this sheet in the next few weeks. These instructions provide you with information about caring for yourself after your procedure. Your health care provider may also give you more specific instructions. Your treatment has been planned according to current medical practices, but problems sometimes occur. Call your health care provider if you have any problems or questions after your procedure. °WHAT TO EXPECT AFTER THE PROCEDURE °After your procedure, it is typical to have the following: °· Bruising at the catheter insertion site that usually fades within 1-2 weeks. °· Blood collecting in the tissue (hematoma) that may be painful to the touch. It should usually decrease in size and tenderness within 1-2 weeks. °HOME CARE INSTRUCTIONS °· Take medicines only as directed by your health care provider. °· You may shower 24-48 hours after the procedure or as directed by your health care provider. Remove the bandage (dressing) and gently wash the site with plain soap and water. Pat the area dry with a clean towel. Do not rub the site, because this may cause bleeding. °· Do not take baths, swim, or use a hot tub until your health care provider approves. °· Check your insertion site every day for redness, swelling, or drainage. °· Do not apply powder or lotion to the site. °· Do not lift over 10 lb (4.5 kg) for 5 days after your procedure or as directed by your health care provider. °· Ask your health care provider when it is okay to: °¨ Return to work or school. °¨ Resume usual physical activities or sports. °¨ Resume sexual activity. °· Do not drive home if you are discharged the same day as the procedure. Have someone else drive you. °· You may drive 24 hours after the procedure unless otherwise instructed by your health care provider. °· Do not operate machinery or power tools for 24 hours after the procedure or as directed by your health care provider. °· If your procedure was done as an  outpatient procedure, which means that you went home the same day as your procedure, a responsible adult should be with you for the first 24 hours after you arrive home. °· Keep all follow-up visits as directed by your health care provider. This is important. °SEEK MEDICAL CARE IF: °· You have a fever. °· You have chills. °· You have increased bleeding from the catheter insertion site. Hold pressure on the site. °SEEK IMMEDIATE MEDICAL CARE IF: °· You have unusual pain at the catheter insertion site. °· You have redness, warmth, or swelling at the catheter insertion site. °· You have drainage (other than a small amount of blood on the dressing) from the catheter insertion site. °· The catheter insertion site is bleeding, and the bleeding does not stop after 30 minutes of holding steady pressure on the site. °· The area near or just beyond the catheter insertion site becomes pale, cool, tingly, or numb. °  °This information is not intended to replace advice given to you by your health care provider. Make sure you discuss any questions you have with your health care provider. °  °Document Released: 08/11/2004 Document Revised: 02/13/2014 Document Reviewed: 06/26/2012 °Elsevier Interactive Patient Education ©2016 Elsevier Inc[employer] °Radial Site Care °Refer to this sheet in the next few weeks. These instructions provide you with information about caring for yourself after your procedure. Your health care provider may also give you more specific instructions. Your treatment has been planned according to current medical practices, but problems sometimes occur. Call your   health care provider if you have any problems or questions after your procedure. WHAT TO EXPECT AFTER THE PROCEDURE After your procedure, it is typical to have the following:  Bruising at the radial site that usually fades within 1-2 weeks.  Blood collecting in the tissue (hematoma) that may be painful to the touch. It should usually decrease in size and  tenderness within 1-2 weeks. HOME CARE INSTRUCTIONS  Take medicines only as directed by your health care provider.  You may shower 24-48 hours after the procedure or as directed by your health care provider. Remove the bandage (dressing) and gently wash the site with plain soap and water. Pat the area dry with a clean towel. Do not rub the site, because this may cause bleeding.  Do not take baths, swim, or use a hot tub until your health care provider approves.  Check your insertion site every day for redness, swelling, or drainage.  Do not apply powder or lotion to the site.  Do not flex or bend the affected arm for 24 hours or as directed by your health care provider.  Do not push or pull heavy objects with the affected arm for 24 hours or as directed by your health care provider.  Do not lift over 10 lb (4.5 kg) for 5 days after your procedure or as directed by your health care provider.  Ask your health care provider when it is okay to:  Return to work or school.  Resume usual physical activities or sports.  Resume sexual activity.  Do not drive home if you are discharged the same day as the procedure. Have someone else drive you.  You may drive 24 hours after the procedure unless otherwise instructed by your health care provider.  Do not operate machinery or power tools for 24 hours after the procedure.  If your procedure was done as an outpatient procedure, which means that you went home the same day as your procedure, a responsible adult should be with you for the first 24 hours after you arrive home.  Keep all follow-up visits as directed by your health care provider. This is important. SEEK MEDICAL CARE IF:  You have a fever.  You have chills.  You have increased bleeding from the radial site. Hold pressure on the site. SEEK IMMEDIATE MEDICAL CARE IF:  You have unusual pain at the radial site.  You have redness, warmth, or swelling at the radial site.  You  have drainage (other than a small amount of blood on the dressing) from the radial site.  The radial site is bleeding, and the bleeding does not stop after 30 minutes of holding steady pressure on the site.  Your arm or hand becomes pale, cool, tingly, or numb.   This information is not intended to replace advice given to you by your health care provider. Make sure you discuss any questions you have with your health care provider.   Document Released: 02/25/2010 Document Revised: 02/13/2014 Document Reviewed: 08/11/2013 Elsevier Interactive Patient Education [employer]. Return to Work ________Kenneth King__________________________________________ was treated at our facility. 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  Employee may return to modified work on _____3/16/2017_______________. WORK ACTIVITY RESTRICTIONS Work activities that are not tolerated include: _____ Bending _____ Prolonged sitting __X___ Lifting more than ____5______________ lb _____ Squatting _____ Prolonged standing _____ Climbing _____ Reaching _____ Pushing and pulling  _____ Walking _____ Other ____________________ These restrictions are effective until _____3/20/2017_______________. Show this Return to Work statement to your supervisor at work as soon as possible. Your employer should be aware of your condition and can help with the necessary work activity restrictions. If you wish to return to work sooner than the date that is listed above, or if you have further problems that make it difficult for you to return at that time, please call our clinic or your health care provider. _________________________________________ Health Care Provider Name (printed) _________________________________________ Health Care Provider (signature)    _________________________________________ Date   This information is not intended to replace advice given to you by your health care provider. Make sure you discuss any questions you have with your health care provider.   Document Released: 01/23/2005 Document Revised: 02/13/2014 Document Reviewed: 08/22/2013 Elsevier Interactive Patient Education [employer].

## 2015-04-20 NOTE — Telephone Encounter (Signed)
I spoke to Samuel Gonzalez and he is aware of results and recommendations. He is willing to proceed with titration study.

## 2015-04-20 NOTE — Interval H&P Note (Signed)
History and Physical Interval Note:  04/20/2015 9:40 AM  Samuel Gonzalez  has presented today for surgery, with the diagnosis of angina/cp  The various methods of treatment have been discussed with the patient and family. After consideration of risks, benefits and other options for treatment, the patient has consented to  Procedure(s): Left Heart Cath and Coronary Angiography (N/A) and possible PCI as a surgical intervention .  The patient's history has been reviewed, patient examined, no change in status, stable for surgery.  I have reviewed the patient's chart and labs.  Questions were answered to the patient's satisfaction.    Ischemic Symptoms? CCS III (Marked limitation of ordinary activity) Anti-ischemic Medical Therapy? Maximal Medical Therapy (2 or more classes of medications) Non-invasive Test Results? Intermediate-risk stress test findings: cardiac mortality 1-3%/year Prior CABG? No Previous CABG   Patient Information:   1-2V CAD, no prox LAD  A (8)  Indication: 17; Score: 8   Patient Information:   CTO of 1 vessel, no other CAD  A (7)  Indication: 27; Score: 7   Patient Information:   1V CAD with prox LAD  A (9)  Indication: 33; Score: 9   Patient Information:   2V-CAD with prox LAD  A (9)  Indication: 39; Score: 9   Patient Information:   3V-CAD without LMCA  A (9)  Indication: 45; Score: 9   Patient Information:   3V-CAD without LMCA With Abnormal LV systolic function  A (9)  Indication: 48; Score: 9   Patient Information:   LMCA-CAD  A (9)  Indication: 49; Score: 9   Patient Information:   2V-CAD with prox LAD PCI  A (7)  Indication: 62; Score: 7   Patient Information:   2V-CAD with prox LAD CABG  A (8)  Indication: 62; Score: 8   Patient Information:   3V-CAD without LMCA With Low CAD burden(i.e., 3 focal stenoses, low SYNTAX score) PCI  A (7)  Indication: 63; Score: 7   Patient Information:   3V-CAD without  LMCA With Low CAD burden(i.e., 3 focal stenoses, low SYNTAX score) CABG  A (9)  Indication: 63; Score: 9   Patient Information:   3V-CAD without LMCA E06c - Intermediate-high CAD burden (i.e., multiple diffuse lesions, presence of CTO, or high SYNTAX score) PCI  U (4)  Indication: 64; Score: 4   Patient Information:   3V-CAD without LMCA E06c - Intermediate-high CAD burden (i.e., multiple diffuse lesions, presence of CTO, or high SYNTAX score) CABG  A (9)  Indication: 64; Score: 9   Patient Information:   LMCA-CAD With Isolated LMCA stenosis  PCI  U (6)  Indication: 65; Score: 6   Patient Information:   LMCA-CAD With Isolated LMCA stenosis  CABG  A (9)  Indication: 65; Score: 9   Patient Information:   LMCA-CAD Additional CAD, low CAD burden (i.e., 1- to 2-vessel additional involvement, low SYNTAX score) PCI  U (5)  Indication: 66; Score: 5   Patient Information:   LMCA-CAD Additional CAD, low CAD burden (i.e., 1- to 2-vessel additional involvement, low SYNTAX score) CABG  A (9)  Indication: 66; Score: 9   Patient Information:   LMCA-CAD Additional CAD, intermediate-high CAD burden (i.e., 3-vessel involvement, presence of CTO, or high SYNTAX score) PCI  I (3)  Indication: 67; Score: 3   Patient Information:   LMCA-CAD Additional CAD, intermediate-high CAD burden (i.e., 3-vessel involvement, presence of CTO, or high SYNTAX score) CABG  A (9)  Indication: 67; Score: 9  Adrian Prows

## 2015-05-07 ENCOUNTER — Ambulatory Visit (INDEPENDENT_AMBULATORY_CARE_PROVIDER_SITE_OTHER): Payer: Commercial Managed Care - HMO | Admitting: Neurology

## 2015-05-07 DIAGNOSIS — G4733 Obstructive sleep apnea (adult) (pediatric): Secondary | ICD-10-CM

## 2015-05-07 DIAGNOSIS — G4761 Periodic limb movement disorder: Secondary | ICD-10-CM

## 2015-05-07 DIAGNOSIS — G479 Sleep disorder, unspecified: Secondary | ICD-10-CM

## 2015-05-08 NOTE — Sleep Study (Signed)
Please see the scanned sleep study interpretation located in the procedure tab within the chart review section.   

## 2015-05-10 ENCOUNTER — Ambulatory Visit: Payer: Self-pay | Admitting: Cardiology

## 2015-05-11 ENCOUNTER — Telehealth: Payer: Self-pay | Admitting: Neurology

## 2015-05-11 DIAGNOSIS — G4733 Obstructive sleep apnea (adult) (pediatric): Secondary | ICD-10-CM

## 2015-05-11 NOTE — Telephone Encounter (Signed)
Lm for patient to call back for results.  

## 2015-05-11 NOTE — Telephone Encounter (Signed)
Patient referred by Dr. Einar Gip, seen by me on 03/25/15, diagnostic PSG on 04/16/15, cpap study on 05/07/15, ins: [employer]. Please call and inform patient that I have entered an order for treatment with positive airway pressure (PAP) treatment of obstructive sleep apnea (OSA). He did well during the latest sleep study with CPAP. We will, therefore, arrange for a machine for home use through a DME (durable medical equipment) company of His choice; and I will see the patient back in follow-up in about 8-10 weeks. Please also explain to the patient that I will be looking out for compliance data, which can be downloaded from the machine (stored on an SD card, that is inserted in the machine) or via remote access through a modem, that is built into the machine. At the time of the followup appointment we will discuss sleep study results and how it is going with PAP treatment at home. Please advise patient to bring His machine at the time of the first FU visit, even though this is cumbersome. Bringing the machine for every visit after that will likely not be needed, but often helps for the first visit to troubleshoot if needed. Please re-enforce the importance of compliance with treatment and the need for [place] to monitor compliance data - often an insurance requirement and actually good feedback for the patient as far as how they are doing.  Also remind patient, that any interim PAP machine or mask issues should be first addressed with the DME company, as they can often help better with technical and mask fit issues. Please ask if patient has a preference regarding DME company.  Please also make sure, the patient has a follow-up appointment with me in about 8-10 weeks from the setup date, thanks.  Once you have spoken to the patient - and faxed/routed report to PCP and referring MD (if other than PCP), you can close this encounter, thanks,   Star Age, MD, PhD [facility] ([place])

## 2015-05-12 NOTE — Telephone Encounter (Signed)
Pt returned call. He works 3rd shift and is asking that a call first thing in the morning be done. Thank you , May leave message as well.

## 2015-05-13 NOTE — Telephone Encounter (Signed)
I spoke to patient and he is aware of results and recommendations. He is willing to proceed with treatment. I will send orders to DME. I will mail the patient a letter remining him to make f/u appt and stress the importance of compliance.

## 2015-08-11 ENCOUNTER — Encounter: Payer: Self-pay | Admitting: Neurology

## 2015-08-11 ENCOUNTER — Ambulatory Visit (INDEPENDENT_AMBULATORY_CARE_PROVIDER_SITE_OTHER): Payer: Commercial Managed Care - HMO | Admitting: Neurology

## 2015-08-11 VITALS — BP 127/77 | HR 50 | Resp 18 | Ht 71.0 in | Wt 258.0 lb

## 2015-08-11 DIAGNOSIS — G4726 Circadian rhythm sleep disorder, shift work type: Secondary | ICD-10-CM

## 2015-08-11 DIAGNOSIS — G4733 Obstructive sleep apnea (adult) (pediatric): Secondary | ICD-10-CM

## 2015-08-11 DIAGNOSIS — Z9989 Dependence on other enabling machines and devices: Principal | ICD-10-CM

## 2015-08-11 DIAGNOSIS — E669 Obesity, unspecified: Secondary | ICD-10-CM | POA: Diagnosis not present

## 2015-08-11 NOTE — Progress Notes (Signed)
Subjective:    Patient ID: Samuel Gonzalez is a 51 y.o. male.  HPI     Interim history:   Samuel Gonzalez is a 50 year old right-handed gentleman with an underlying medical history of chest pain, shift work, and obesity, who presents for follow-up consultation of his obstructive sleep apnea, after his recent sleep studies. The patient is unaccompanied today. Her first met him on 03/25/2015 at the request of his cardiologist, at which time the patient reported snoring and excessive daytime somnolence. He has been a third shift worker for years. I asked him to return for sleep study. He had a baseline sleep study, followed by a CPAP titration study. I went over his test results with him in detail today. His baseline sleep study from 04/16/2015 showed a sleep efficiency of 92.3%, sleep latency was 5 minutes, wake after sleep onset was 33 minutes with moderate sleep fragmentation noted. He had an increased percentage of REM sleep at 38.3%, REM latency was 54.5 minutes. Of note, the patient is a third shift worker and at the time of the study start he had been awake for over 24 hours as he reported. He had borderline PLMS with an index of 5.7 per hour, with 0.1 arousals per hour. He had no significant EKG changes. Mild to moderate snoring was noted. Total AHI was 7.6 per hour, average oxygen saturation was 91%, nadir was 82%. Time below 90% saturation was 2 hours and 4 minutes. Based on his medical history, and his sleep related complaints I asked him to return for full night CPAP titration study. He had this on 05/07/2015. Sleep efficiency was 92.5%, sleep latency was 5 minutes, wake after sleep onset was 32.5 minutes with mild to moderate sleep fragmentation noted. He had a normal arousal index. He had slow-wave sleep at 12.8%, and REM sleep was 31.2% with a REM latency of 62 minutes. He had mild PLMS with an index of 17.9 per hour, resulting in 1.6 arousals per hour. He had no significant EKG or EEG changes. He had  a total of one central apnea and 8 obstructive hypopneas for the night. CPAP was titrated from 5 cm to 8 cm. AHI was 0.3 per hour at the final pressure with supine REM sleep achieved. Average oxygen saturation was 93%, nadir was 87%. Time below 90% saturation was 6 minutes and 24 seconds for the night. Based on his test results I prescribed CPAP therapy for home use.  Today, 08/11/2015: I reviewed his CPAP compliance data from 07/10/2015 through 08/08/2015 which is a total of 30 days during which time he used his machine every night with percent used days greater than 4 hours at 83%, indicating very good compliance with an average usage for all nights of 5 hours and 50 minutes, residual AHI 1.8 per hour, leak acceptable with the 95th percentile at 10.1 L/m on a pressure of 8 cm with EPR of 3.  Today, 08/11/2015: He reports doing well, sleeping better with CPAP, not as tired during the day. Wife is pleased that he no longer snores. He has no overt RLS symptoms and is not aware of his PLMs. In the interim, he had left heart catheterization and coronary angiogram under Dr. Einar Gip on 04/20/2015. Went well, no blockages per his verbal report. He had a change from Lipitor to Crestor very recently because his liver enzymes had increased on Lipitor. He is going to see a nutritionist soon.  Previously:   03/25/2015: He reports snoring and excessive daytime somnolence.  He has worked 3rd shift for years, had a gap for 2 years when he worked days and is back on 3rd shift for the past 6 months. He works in Scient[employer], clinical (histocompatibility and immunogenetics). He works from midnight to 8:30 AM. Bedtime is around 1 to 2 PM and rise time is usually around 8 PM. He lives at home with his wife, one daughter and one granddaughter. He is a nonsmoker. He drinks alcohol infrequently, 2-3 times a month, and caffeine in the form of cola, 2-3 cans per day. He denies restless leg symptoms or nocturia. He denies a family history of OSA. His wife has noted  abnormal sounds while he is asleep including snorting. He has occasional morning headaches. He had a cardiac SPECT scan on 03/19/2015 and an echocardiogram on 03/24/2015 with results pending. He has an appointment in your office later on this month. His EKG on 03/11/2015 was reportedly normal per your records. He was started on metoprolol and baby aspirin recently. He was also started on atorvastatin. He had blood work through your office with results pending. He was given a prescription for nitroglycerin which he has not used yet. He denies reflux symptoms at night. His Epworth sleepiness score is 10 out of 24 today, his fatigue score is 22 out of 63. I reviewed your office note from 03/11/2015, which you kindly included.  His Past Medical History Is Significant For: Past Medical History  Diagnosis Date  . Chest pain     His Past Surgical History Is Significant For: Past Surgical History  Procedure Laterality Date  . Cardiac catheterization N/A 04/20/2015    Procedure: Left Heart Cath and Coronary Angiography;  Surgeon: Adrian Prows, MD;  Location: [facility];  Service: Cardiovascular;  Laterality: N/A;    His Family History Is Significant For: Family History  Problem Relation Age of Onset  . Diabetes Mother   . Hypertension Mother   . Heart disease Maternal Grandmother   . Heart disease Maternal Grandfather   . Cancer Paternal Grandmother   . Cancer Paternal Grandfather     His Social History Is Significant For: Social History   Social History  . Marital Status: Married    Spouse Name: N/A  . Number of Children: 2  . Years of Education: [employer]   Social History Main Topics  . Smoking status: Never Smoker   . Smokeless tobacco: None  . Alcohol Use: 0.0 oz/week    0 Standard drinks or equivalent per week  . Drug Use: No  . Sexual Activity: Not Asked   Other Topics Concern  . None   Social History Narrative   Drinks 2-3 cokes a day     His Allergies Are:   Allergies  Allergen Reactions  . Aspirin Hives    Childhood reaction, takes 73m daily with no issues  . Peanut-Containing Drug Products Other (See Comments)  :   His Current Medications Are:  Outpatient Encounter Prescriptions as of 08/11/2015  Medication Sig  . aspirin 81 MG tablet Take 81 mg by mouth daily.  . famotidine (PEPCID) 40 MG tablet Take 1 tablet (40 mg total) by mouth 2 (two) times daily.  . metoprolol succinate (TOPROL-XL) 25 MG 24 hr tablet Take 25 mg by mouth daily.  . nitroGLYCERIN (NITROSTAT) 0.4 MG SL tablet 1 (ONE) TABLET UNDER TONGUE EVERY MINUTES AS NEEDED FOR CHEST PAIN  . rosuvastatin (CRESTOR) 20 MG tablet Take 20 mg by mouth daily.  . [DISCONTINUED] atorvastatin (LIPITOR) 80 MG tablet Take  80 mg by mouth daily.   No facility-administered encounter medications on file as of 08/11/2015.  :  Review of Systems:  Out of a complete 14 point review of systems, all are reviewed and negative with the exception of these symptoms as listed below:   Review of Systems  Neurological:       Patient states that he is doing well with CPAP, no new concerns.     Objective:  Neurologic Exam  Physical Exam Physical Examination:   Filed Vitals:   08/11/15 0831  BP: 127/77  Pulse: 50  Resp: 18    General Examination: The patient is a very pleasant 50 y.o. male in no acute distress. He appears well-developed and well-nourished and well groomed. He is in good spirits today.  HEENT: Normocephalic, atraumatic, pupils are equal, round and reactive to light and accommodation. Extraocular tracking is good without limitation to gaze excursion or nystagmus noted. Normal smooth pursuit is noted. Hearing is grossly intact. Face is symmetric with normal facial animation and normal facial sensation. Speech is clear with no dysarthria noted. There is no hypophonia. There is no lip, neck/head, jaw or voice tremor. Neck is supple with full range of passive and active motion. There are no  carotid bruits on auscultation. Oropharynx exam reveals: mild mouth dryness, adequate dental hygiene and marked airway crowding, due to narrow airway entry, redundant and thicker soft palate and large uvula and tonsils of 2+ in place, thicker tongue. Mallampati is class III. Tongue protrudes centrally and palate elevates symmetrically. Tonsils are 2+ in size.   Chest: Clear to auscultation without wheezing, rhonchi or crackles noted.  Heart: S1+S2+0, regular and normal without murmurs, rubs or gallops noted.   Abdomen: Soft, non-tender and non-distended with normal bowel sounds appreciated on auscultation.  Extremities: There is no pitting edema in the distal lower extremities bilaterally. Pedal pulses are intact.  Skin: Warm and dry without trophic changes noted. There are no varicose veins.  Musculoskeletal: exam reveals no obvious joint deformities, tenderness or joint swelling or erythema.   Neurologically:  Mental status: The patient is awake, alert and oriented in all 4 spheres. His immediate and remote memory, attention, language skills and fund of knowledge are appropriate. There is no evidence of aphasia, agnosia, apraxia or anomia. Speech is clear with normal prosody and enunciation. Thought process is linear. Mood is normal and affect is normal.  Cranial nerves II - XII are as described above under HEENT exam. In addition: shoulder shrug is normal with equal shoulder height noted. Motor exam: Normal bulk, strength and tone is noted. There is no drift, tremor or rebound. Romberg is negative. Fine motor skills and coordination: intact in the UEs and LEs.   Cerebellar testing: No dysmetria or intention tremor. There is no truncal or gait ataxia.  Sensory exam: intact to light touch in the upper and lower extremities.  Gait, station and balance: He stands easily. No veering to one side is noted. No leaning to one side is noted. Posture is age-appropriate and stance is narrow based. Gait  shows normal stride length and normal pace. No problems turning are noted. Tandem walk is unremarkable.   Assessment and Plan:   In summary, Samuel Gonzalez is a very pleasant 50 year old male with an underlying medical history of chest pain, shift work, and obesity, who presents for follow-up consultation of his obstructive sleep apnea, after his recent sleep studies. He had a baseline sleep study on 04/16/2015, followed by a CPAP  titration study on 05/07/2015. We talked about his test results in detail today. He has been established on CPAP therapy at a pressure of 8 cm with good results reported good compliance. He is commended for this. His physical exam is stable. I again explained in particular the risks and ramifications of untreated moderate to severe OSA, especially with respect to developing cardiovascular disease down the Road, including congestive heart failure, difficult to treat hypertension, cardiac arrhythmias, or stroke. Even type 2 diabetes has, in part, been linked to untreated OSA. Symptoms of untreated OSA include daytime sleepiness, memory problems, mood irritability and mood disorder such as depression and anxiety, lack of energy, as well as recurrent headaches, especially morning headaches. We talked about trying to maintain a healthy lifestyle in general, as well as the importance of weight control. He is also going to work on his weight management with the help of a nutritionist. I explained the importance of being compliant with PAP treatment, not only for insurance purposes but primarily to improve His symptoms, and for the patient's long term health benefit, including to reduce His cardiovascular risks. At this juncture, I suggested a 6 month follow-up, yearly thereafter if he continues to do well. I answered all his questions today and he was in agreement. I spent 25 minutes in total face-to-face time with the patient, more than 50% of which was spent in counseling and coordination  of care, reviewing test results, reviewing medication and discussing or reviewing the diagnosis of OSA, its prognosis and treatment options.

## 2015-08-11 NOTE — Patient Instructions (Signed)

## 2016-02-15 ENCOUNTER — Ambulatory Visit: Payer: Commercial Managed Care - HMO | Admitting: Neurology

## 2016-04-18 ENCOUNTER — Encounter: Payer: Self-pay | Admitting: Adult Health

## 2016-04-20 ENCOUNTER — Ambulatory Visit (INDEPENDENT_AMBULATORY_CARE_PROVIDER_SITE_OTHER): Payer: Commercial Managed Care - HMO | Admitting: Adult Health

## 2016-04-20 ENCOUNTER — Encounter: Payer: Self-pay | Admitting: Adult Health

## 2016-04-20 VITALS — BP 130/82 | HR 54 | Ht 71.0 in | Wt 276.4 lb

## 2016-04-20 DIAGNOSIS — Z9989 Dependence on other enabling machines and devices: Secondary | ICD-10-CM

## 2016-04-20 DIAGNOSIS — G4733 Obstructive sleep apnea (adult) (pediatric): Secondary | ICD-10-CM | POA: Diagnosis not present

## 2016-04-20 NOTE — Patient Instructions (Signed)
Continue using CPAP nightly If your symptoms worsen or you develop new symptoms please let us know.   

## 2016-04-20 NOTE — Progress Notes (Addendum)
PATIENT: Samuel Gonzalez DOB: [birth year]  REASON FOR VISIT: follow up- OSA on CPAP HISTORY FROM: patient  HISTORY OF PRESENT ILLNESS: Samuel Gonzalez is a 51 year old male with a history of obstructive sleep apnea on CPAP. He returns today for a compliance download. His download indicates that he uses machine 30 out of 30 days for compliance of 100%. He uses machine greater than 4 hours every night. His average use is 6 hours and 15 minutes. His residual AHI is 0.9 on 8 cm of water with EPR 3. He does not have a significant leak. The patient feels that the machine is working well for him. He has the nasal pillows. Denies any daytime sleepiness. Reports that his Epworth sleepiness score is 3 and fatigue severity score is 14. He returns today for an evaluation.  HISTORY per Dr. Guadelupe Sabin notes: Samuel Gonzalez is a 51 year old right-handed gentleman with an underlying medical history of chest pain, shift work, and obesity, who presents for follow-up consultation of his obstructive sleep apnea, after his recent sleep studies. The patient is unaccompanied today. Her first met him on 03/25/2015 at the request of his cardiologist, at which time the patient reported snoring and excessive daytime somnolence. He has been a third shift worker for years. I asked him to return for sleep study. He had a baseline sleep study, followed by a CPAP titration study. I went over his test results with him in detail today. His baseline sleep study from 04/16/2015 showed a sleep efficiency of 92.3%, sleep latency was 5 minutes, wake after sleep onset was 33 minutes with moderate sleep fragmentation noted. He had an increased percentage of REM sleep at 38.3%, REM latency was 54.5 minutes. Of note, the patient is a third shift worker and at the time of the study start he had been awake for over 24 hours as he reported. He had borderline PLMS with an index of 5.7 per hour, with 0.1 arousals per hour. He had no significant EKG changes. Mild to  moderate snoring was noted. Total AHI was 7.6 per hour, average oxygen saturation was 91%, nadir was 82%. Time below 90% saturation was 2 hours and 4 minutes. Based on his medical history, and his sleep related complaints I asked him to return for full night CPAP titration study. He had this on 05/07/2015. Sleep efficiency was 92.5%, sleep latency was 5 minutes, wake after sleep onset was 32.5 minutes with mild to moderate sleep fragmentation noted. He had a normal arousal index. He had slow-wave sleep at 12.8%, and REM sleep was 31.2% with a REM latency of 62 minutes. He had mild PLMS with an index of 17.9 per hour, resulting in 1.6 arousals per hour. He had no significant EKG or EEG changes. He had a total of one central apnea and 8 obstructive hypopneas for the night. CPAP was titrated from 5 cm to 8 cm. AHI was 0.3 per hour at the final pressure with supine REM sleep achieved. Average oxygen saturation was 93%, nadir was 87%. Time below 90% saturation was 6 minutes and 24 seconds for the night. Based on his test results I prescribed CPAP therapy for home use.  Today, 08/11/2015: I reviewed his CPAP compliance data from 07/10/2015 through 08/08/2015 which is a total of 30 days during which time he used his machine every night with percent used days greater than 4 hours at 83%, indicating very good compliance with an average usage for all nights of 5 hours and 50 minutes, residual  AHI 1.8 per hour, leak acceptable with the 95th percentile at 10.1 L/m on a pressure of 8 cm with EPR of 3.  08/11/2015: He reports doing well, sleeping better with CPAP, not as tired during the day. Wife is pleased that he no longer snores. He has no overt RLS symptoms and is not aware of his PLMs. In the interim, he had left heart catheterization and coronary angiogram under Dr. Einar Gip on 04/20/2015. Went well, no blockages per his verbal report. He had a change from Lipitor to Crestor very recently because his liver enzymes had  increased on Lipitor. He is going to see a nutritionist soon.   REVIEW OF SYSTEMS: Out of a complete 14 system review of symptoms, the patient complains only of the following symptoms, and all other reviewed systems are negative.  Insomnia, frequent waking  ALLERGIES: Allergies  Allergen Reactions  . Aspirin Hives    Childhood reaction, takes '81mg'$  daily with no issues  . Peanut-Containing Drug Products Other (See Comments)    HOME MEDICATIONS: Outpatient Medications Prior to Visit  Medication Sig Dispense Refill  . aspirin 81 MG tablet Take 81 mg by mouth daily.    . famotidine (PEPCID) 40 MG tablet Take 1 tablet (40 mg total) by mouth 2 (two) times daily. 60 tablet 1  . metoprolol succinate (TOPROL-XL) 25 MG 24 hr tablet Take 25 mg by mouth daily.  1  . nitroGLYCERIN (NITROSTAT) 0.4 MG SL tablet 1 (ONE) TABLET UNDER TONGUE EVERY MINUTES AS NEEDED FOR CHEST PAIN  1  . rosuvastatin (CRESTOR) 20 MG tablet Take 20 mg by mouth daily.  5   No facility-administered medications prior to visit.     PAST MEDICAL HISTORY: Past Medical History:  Diagnosis Date  . Chest pain     PAST SURGICAL HISTORY: Past Surgical History:  Procedure Laterality Date  . CARDIAC CATHETERIZATION N/A 04/20/2015   Procedure: Left Heart Cath and Coronary Angiography;  Surgeon: Adrian Prows, MD;  Location: [facility];  Service: Cardiovascular;  Laterality: N/A;    FAMILY HISTORY: Family History  Problem Relation Age of Onset  . Diabetes Mother   . Hypertension Mother   . Heart disease Maternal Grandmother   . Heart disease Maternal Grandfather   . Cancer Paternal Grandmother   . Cancer Paternal Grandfather     SOCIAL HISTORY: Social History   Social History  . Marital status: Married    Spouse name: N/A  . Number of children: 2  . Years of education: College   Occupational History  . Not on file.   Social History Main Topics  . Smoking status: Never Smoker  . Smokeless tobacco: Not  on file  . Alcohol use 0.0 oz/week  . Drug use: No  . Sexual activity: Not on file   Other Topics Concern  . Not on file   Social History Narrative   Drinks 2-3 cokes a day       PHYSICAL EXAM  There were no vitals filed for this visit. There is no height or weight on file to calculate BMI.  Generalized: Well developed, in no acute distress   Neurological examination  Mentation: Alert oriented to time, place, history taking. Follows all commands speech and language fluent Cranial nerve II-XII: Pupils were equal round reactive to light. Extraocular movements were full, visual field were full on confrontational test. Facial sensation and strength were normal. Uvula tongue midline. Head turning and shoulder shrug  were normal and symmetric. Neck circumference  16 inches, Mallampati 3+ Motor: The motor testing reveals 5 over 5 strength of all 4 extremities. Good symmetric motor tone is noted throughout.  Sensory: Sensory testing is intact to soft touch on all 4 extremities. No evidence of extinction is noted.  Coordination: Cerebellar testing reveals good finger-nose-finger and heel-to-shin bilaterally.  Gait and station: Gait is normal.  Reflexes: Deep tendon reflexes are symmetric and normal bilaterally.   DIAGNOSTIC DATA (LABS, IMAGING, TESTING) - I reviewed patient records, labs, notes, testing and imaging myself where available.  Lab Results  Component Value Date   WBC 11.1 (H) 03/06/2015   HGB 14.3 03/06/2015   HCT 42.4 03/06/2015   MCV 85.1 03/06/2015   PLT 330 03/06/2015      Component Value Date/Time   NA 139 03/06/2015 1736   K 3.5 03/06/2015 1736   CL 105 03/06/2015 1736   CO2 25 03/06/2015 1736   GLUCOSE 112 (H) 03/06/2015 1736   BUN 17 03/06/2015 1736   CREATININE 0.93 03/06/2015 1736   CALCIUM 8.8 (L) 03/06/2015 1736   GFRNONAA >60 03/06/2015 1736   GFRAA >60 03/06/2015 1736     ASSESSMENT AND PLAN 51 y.o. year old male  has a past medical history of  Chest pain. here with:  1. Obstructive sleep apnea on CPAP  Overall the patient is doing well. His download shows excellent compliance in the treatment of his apnea. Patient is encouraged to continue using the CPAP nightly. He should change his supplies every 3-6 months. Advised patient that if his symptoms worsen or he develops new symptoms he should let us know. He will follow-up in one year with Dr. Rexene Alberts  I spent 15 minutes with the patient 50% of this time was spent reviewing the patient's download.   Ward Givens, MSN, NP-C 04/20/2016, 7:51 AM [facility] [address] [place], [place] [address] [phone]  I reviewed the above note and documentation by the Nurse Practitioner and agree with the history, physical exam, assessment and plan as outlined above. I was immediately available for face-to-face consultation. Star Age, MD, PhD [facility])

## 2016-07-12 DIAGNOSIS — E559 Vitamin D deficiency, unspecified: Secondary | ICD-10-CM | POA: Diagnosis not present

## 2016-07-12 DIAGNOSIS — R7303 Prediabetes: Secondary | ICD-10-CM | POA: Diagnosis not present

## 2016-07-26 DIAGNOSIS — E291 Testicular hypofunction: Secondary | ICD-10-CM | POA: Diagnosis not present

## 2016-07-26 DIAGNOSIS — R7303 Prediabetes: Secondary | ICD-10-CM | POA: Diagnosis not present

## 2016-07-28 DIAGNOSIS — E291 Testicular hypofunction: Secondary | ICD-10-CM | POA: Diagnosis not present

## 2016-10-27 DIAGNOSIS — K469 Unspecified abdominal hernia without obstruction or gangrene: Secondary | ICD-10-CM | POA: Diagnosis not present

## 2016-10-27 DIAGNOSIS — R197 Diarrhea, unspecified: Secondary | ICD-10-CM | POA: Diagnosis not present

## 2016-10-27 DIAGNOSIS — L989 Disorder of the skin and subcutaneous tissue, unspecified: Secondary | ICD-10-CM | POA: Diagnosis not present

## 2016-10-30 ENCOUNTER — Other Ambulatory Visit (HOSPITAL_BASED_OUTPATIENT_CLINIC_OR_DEPARTMENT_OTHER): Payer: Self-pay | Admitting: Physician Assistant

## 2016-10-30 DIAGNOSIS — K469 Unspecified abdominal hernia without obstruction or gangrene: Secondary | ICD-10-CM

## 2016-11-01 ENCOUNTER — Ambulatory Visit (HOSPITAL_BASED_OUTPATIENT_CLINIC_OR_DEPARTMENT_OTHER)
Admission: RE | Admit: 2016-11-01 | Discharge: 2016-11-01 | Disposition: A | Discharge status: Home / Self Care | Payer: 59 | Source: Ambulatory Visit | Attending: Physician Assistant | Admitting: Physician Assistant

## 2016-11-01 DIAGNOSIS — K760 Fatty (change of) liver, not elsewhere classified: Secondary | ICD-10-CM | POA: Insufficient documentation

## 2016-11-01 DIAGNOSIS — K469 Unspecified abdominal hernia without obstruction or gangrene: Secondary | ICD-10-CM | POA: Insufficient documentation

## 2016-11-01 DIAGNOSIS — N200 Calculus of kidney: Secondary | ICD-10-CM | POA: Insufficient documentation

## 2016-11-01 DIAGNOSIS — I708 Atherosclerosis of other arteries: Secondary | ICD-10-CM | POA: Insufficient documentation

## 2016-11-01 DIAGNOSIS — R197 Diarrhea, unspecified: Secondary | ICD-10-CM | POA: Diagnosis not present

## 2016-11-03 DIAGNOSIS — R300 Dysuria: Secondary | ICD-10-CM | POA: Diagnosis not present

## 2017-01-12 DIAGNOSIS — E291 Testicular hypofunction: Secondary | ICD-10-CM | POA: Diagnosis not present

## 2017-01-17 DIAGNOSIS — R5383 Other fatigue: Secondary | ICD-10-CM | POA: Diagnosis not present

## 2017-01-17 DIAGNOSIS — G479 Sleep disorder, unspecified: Secondary | ICD-10-CM | POA: Diagnosis not present

## 2017-01-17 DIAGNOSIS — E291 Testicular hypofunction: Secondary | ICD-10-CM | POA: Diagnosis not present

## 2017-01-24 DIAGNOSIS — L821 Other seborrheic keratosis: Secondary | ICD-10-CM | POA: Diagnosis not present

## 2017-01-24 DIAGNOSIS — L820 Inflamed seborrheic keratosis: Secondary | ICD-10-CM | POA: Diagnosis not present

## 2017-01-24 DIAGNOSIS — D485 Neoplasm of uncertain behavior of skin: Secondary | ICD-10-CM | POA: Diagnosis not present

## 2017-02-05 DIAGNOSIS — G4733 Obstructive sleep apnea (adult) (pediatric): Secondary | ICD-10-CM | POA: Diagnosis not present

## 2017-03-02 DIAGNOSIS — L918 Other hypertrophic disorders of the skin: Secondary | ICD-10-CM | POA: Diagnosis not present

## 2017-03-02 DIAGNOSIS — E785 Hyperlipidemia, unspecified: Secondary | ICD-10-CM | POA: Diagnosis not present

## 2017-03-02 DIAGNOSIS — R079 Chest pain, unspecified: Secondary | ICD-10-CM | POA: Diagnosis not present

## 2017-03-02 DIAGNOSIS — R030 Elevated blood-pressure reading, without diagnosis of hypertension: Secondary | ICD-10-CM | POA: Diagnosis not present

## 2017-03-20 DIAGNOSIS — I10 Essential (primary) hypertension: Secondary | ICD-10-CM | POA: Diagnosis not present

## 2017-03-20 DIAGNOSIS — L918 Other hypertrophic disorders of the skin: Secondary | ICD-10-CM | POA: Diagnosis not present

## 2017-03-26 DIAGNOSIS — R079 Chest pain, unspecified: Secondary | ICD-10-CM | POA: Diagnosis not present

## 2017-03-26 DIAGNOSIS — R0600 Dyspnea, unspecified: Secondary | ICD-10-CM | POA: Diagnosis not present

## 2017-03-26 DIAGNOSIS — I10 Essential (primary) hypertension: Secondary | ICD-10-CM | POA: Diagnosis not present

## 2017-03-27 ENCOUNTER — Other Ambulatory Visit: Payer: Self-pay

## 2017-03-27 ENCOUNTER — Emergency Department (HOSPITAL_COMMUNITY)
Admission: EM | Admit: 2017-03-27 | Discharge: 2017-03-27 | Disposition: A | Discharge status: Home / Self Care | Payer: 59 | Attending: Emergency Medicine | Admitting: Emergency Medicine

## 2017-03-27 ENCOUNTER — Emergency Department (HOSPITAL_COMMUNITY): Payer: 59

## 2017-03-27 DIAGNOSIS — R0602 Shortness of breath: Secondary | ICD-10-CM | POA: Insufficient documentation

## 2017-03-27 DIAGNOSIS — R072 Precordial pain: Secondary | ICD-10-CM

## 2017-03-27 DIAGNOSIS — R079 Chest pain, unspecified: Secondary | ICD-10-CM | POA: Diagnosis not present

## 2017-03-27 LAB — BASIC METABOLIC PANEL
ANION GAP: 13 (ref 5–15)
BUN: 10 mg/dL (ref 6–20)
CALCIUM: 9.5 mg/dL (ref 8.9–10.3)
CO2: 25 mmol/L (ref 22–32)
Chloride: 101 mmol/L (ref 101–111)
Creatinine, Ser: 1.13 mg/dL (ref 0.61–1.24)
GLUCOSE: 127 mg/dL — AB (ref 65–99)
Potassium: 3.6 mmol/L (ref 3.5–5.1)
Sodium: 139 mmol/L (ref 135–145)

## 2017-03-27 LAB — CBC
HCT: 50.7 % (ref 39.0–52.0)
HEMOGLOBIN: 17.8 g/dL — AB (ref 13.0–17.0)
MCH: 30.8 pg (ref 26.0–34.0)
MCHC: 35.1 g/dL (ref 30.0–36.0)
MCV: 87.7 fL (ref 78.0–100.0)
Platelets: 255 10*3/uL (ref 150–400)
RBC: 5.78 MIL/uL (ref 4.22–5.81)
RDW: 13.5 % (ref 11.5–15.5)
WBC: 15.6 10*3/uL — ABNORMAL HIGH (ref 4.0–10.5)

## 2017-03-27 LAB — I-STAT TROPONIN, ED
Troponin i, poc: 0.01 ng/mL (ref 0.00–0.08)
Troponin i, poc: 0.01 ng/mL (ref 0.00–0.08)

## 2017-03-27 MED ORDER — PANTOPRAZOLE SODIUM 40 MG PO TBEC: 40.0000 mg | DELAYED_RELEASE_TABLET | Freq: Every day | ORAL | 0 refills | Status: AC

## 2017-03-27 MED ORDER — GI COCKTAIL ~~LOC~~
30.0000 mL | Freq: Once | ORAL | Status: AC
  Administered 2017-03-27: 30 mL via ORAL
  Filled 2017-03-27: qty 30

## 2017-03-27 MED ORDER — SUCRALFATE 1 GM/10ML PO SUSP: 1.0000 g | Freq: Three times a day (TID) | ORAL | 0 refills | Status: DC

## 2017-03-27 NOTE — Discharge Instructions (Signed)

## 2017-03-27 NOTE — ED Provider Notes (Signed)
Emergency Department Provider Note   I have reviewed the triage vital signs and the nursing notes.   HISTORY  Chief Complaint Chest Pain and Shortness of Breath   HPI Samuel Gonzalez is a 52 y.o. male with PMH of OSA and precordial chest pain to the emergency department for evaluation of return of chest discomfort.  Symptoms began on Friday afternoon.  He describes a tightness in the center slightly left of his chest which radiates to his arm and neck.  He had resolution of symptoms through Saturday but Sunday began having pain again.  Symptoms are intermittent with no modifying factors.  He reports the chest pain is similar to his prior chest pain episodes in the past.  He has seen Dr. Einar Gip with Cardiology and had a clean left heart cath in 2017. He was released from Cardiology service at that time.  The patient states he is no longer taking his reflux medications but tried some of his wife's Nexium yesterday with no relief.  Denies any fevers or chills.  No productive cough.  He also notes a choking sensation which occurs at night.  He does have sleep apnea and has been using his CPAP.  No difficulty swallowing food or drinking fluids. Saw PCP yesterday who increased BP meds but otherwise no changes.    Past Medical History:  Diagnosis Date  . Chest pain     Patient Active Problem List   Diagnosis Date Noted  . Chest pain 04/18/2015    Past Surgical History:  Procedure Laterality Date  . CARDIAC CATHETERIZATION N/A 04/20/2015   Procedure: Left Heart Cath and Coronary Angiography;  Surgeon: Adrian Prows, MD;  Location: [place] [facility];  Service: Cardiovascular;  Laterality: N/A;    Current Outpatient Rx  . Order #: [id number] Class: Historical Med  . Order #: [id number] Class: Historical Med  . Order #: [id number] Class: Historical Med  . Order #: [id number] Class: Historical Med  . Order #: [id number] Class: Historical Med  . Order #: [id number] Class: Normal  . Order #:  [id number] Class: Historical Med  . Order #: [id number] Class: Print  . Order #: [id number] Class: Print    Allergies Aspirin and Peanut-containing drug products  Family History  Problem Relation Age of Onset  . Diabetes Mother   . Hypertension Mother   . Heart disease Maternal Grandmother   . Heart disease Maternal Grandfather   . Cancer Paternal Grandmother   . Cancer Paternal Grandfather     Social History Social History   Tobacco Use  . Smoking status: Never Smoker  . Smokeless tobacco: Never Used  Substance Use Topics  . Alcohol use: Yes    Alcohol/week: 0.0 oz  . Drug use: No    Review of Systems  Constitutional: No fever/chills Eyes: No visual changes. ENT: No sore throat. Positive throat tightness.  Cardiovascular: Positive chest pain. Respiratory: Denies shortness of breath. Gastrointestinal: No abdominal pain.  No nausea, no vomiting.  No diarrhea.  No constipation. Genitourinary: Negative for dysuria. Musculoskeletal: Negative for back pain. Skin: Negative for rash. Neurological: Negative for headaches, focal weakness or numbness.  10-point ROS otherwise negative.  ____________________________________________   PHYSICAL EXAM:  VITAL SIGNS: ED Triage Vitals  Enc Vitals Group     BP 03/27/17 0909 (!) 142/83     Pulse Rate 03/27/17 0909 68     Resp 03/27/17 0909 18     Temp 03/27/17 0909 98.1 F (36.7 C)     Temp Source 03/27/17 0909 Oral  SpO2 03/27/17 0909 95 %     Weight 03/27/17 0909 271 lb (122.9 kg)     Height 03/27/17 0909 5\' 11"  (1.803 m)     Pain Score 03/27/17 0923 7   Constitutional: Alert and oriented. Well appearing and in no acute distress. Eyes: Conjunctivae are normal.  Head: Atraumatic. Nose: No congestion/rhinnorhea. Mouth/Throat: Mucous membranes are moist.  Oropharynx non-erythematous. Neck: No stridor.   Cardiovascular: Normal rate, regular rhythm. Good peripheral circulation. Grossly normal heart sounds.   Respiratory:  Normal respiratory effort.  No retractions. Lungs CTAB. Gastrointestinal: Soft with mild LUQ tenderness to palpation. No rebound or guarding. No distention.  Musculoskeletal: No lower extremity tenderness nor edema. No gross deformities of extremities. Neurologic:  Normal speech and language. No gross focal neurologic deficits are appreciated.  Skin:  Skin is warm, dry and intact. No rash noted.  ____________________________________________   LABS (all labs ordered are listed, but only abnormal results are displayed)  Labs Reviewed  BASIC METABOLIC PANEL - Abnormal; Notable for the following components:      Result Value   Glucose, Bld 127 (*)    All other components within normal limits  CBC - Abnormal; Notable for the following components:   WBC 15.6 (*)    Hemoglobin 17.8 (*)    All other components within normal limits  I-STAT TROPONIN, ED  I-STAT TROPONIN, ED   ____________________________________________  EKG   EKG Interpretation  Date/Time:  Tuesday March 27 2017 09:12:06 EST Ventricular Rate:  68 PR Interval:  158 QRS Duration: 90 QT Interval:  388 QTC Calculation: 412 R Axis:   63 Text Interpretation:  Sinus rhythm with Premature atrial complexes in a pattern of bigeminy Otherwise normal ECG No STEMI.  Confirmed by Nanda Quinton [id number] on 03/27/2017 3:27:32 PM       ____________________________________________  RADIOLOGY  Dg Chest 2 View  Result Date: 03/27/2017 CLINICAL DATA:  Chest heaviness and shortness of breath for 5 days. EXAM: CHEST  2 VIEW COMPARISON:  03/06/2015 FINDINGS: Midline trachea. Normal heart size and mediastinal contours. No pleural effusion or pneumothorax. Mildly low lung volumes with resultant pulmonary interstitial prominence. No lobar consolidation. IMPRESSION: No acute cardiopulmonary disease. Electronically Signed   By: Abigail Miyamoto M.D.   On: 03/27/2017 09:51     ____________________________________________   PROCEDURES  Procedure(s) performed:   Procedures  None ____________________________________________   INITIAL IMPRESSION / ASSESSMENT AND PLAN / ED COURSE  Pertinent labs & imaging results that were available during my care of the patient were reviewed by me and considered in my medical decision making (see chart for details).  Patient presents to the emergency department with several days of chest pain.  He has a clean heart cath in 2017 was released by cardiology at that time.  Suspect that his current pain symptoms are from a noncardiac source.  Single troponin negative.  Plan for repeat troponin and GI cocktail now.  No concern for impending airway compromise with throat tightness.  He is managing oral secretions and speaking in a clear voice.  Suspect that his nighttime symptoms are likely sleep apnea related.  Encouraged him to continue CPAP use and f/u with sleep team. I reviewed his initial lab work and CXR which have no acute findings. HEART score 1. Low risk for PE by [place].   Differential includes all life-threatening causes for chest pain. This includes but is not exclusive to acute coronary syndrome, aortic dissection, pulmonary embolism, cardiac tamponade, community-acquired pneumonia, pericarditis, musculoskeletal chest  wall pain, etc.  05:17 PM Repeat troponin is negative. Patient with some relief with GI medications. Advised GI follow up for evaluation and consideration of upper endoscopy as an outpatient.   At this time, I do not feel there is any life-threatening condition present. I have reviewed and discussed all results (EKG, imaging, lab, urine as appropriate), exam findings with patient. I have reviewed nursing notes and appropriate previous records.  I feel the patient is safe to be discharged home without further emergent workup. Discussed usual and customary return precautions. Patient and family (if present)  verbalize understanding and are comfortable with this plan.  Patient will follow-up with their primary care provider. If they do not have a primary care provider, information for follow-up has been provided to them. All questions have been answered.   ____________________________________________  FINAL CLINICAL IMPRESSION(S) / ED DIAGNOSES  Final diagnoses:  Precordial chest pain     MEDICATIONS GIVEN DURING THIS VISIT:  Medications  gi cocktail (Maalox,Lidocaine,Donnatal) (30 mLs Oral Given 03/27/17 1601)     NEW OUTPATIENT MEDICATIONS STARTED DURING THIS VISIT:  New Prescriptions   PANTOPRAZOLE (PROTONIX) 40 MG TABLET    Take 1 tablet (40 mg total) by mouth daily.   SUCRALFATE (CARAFATE) 1 GM/10ML SUSPENSION    Take 10 mLs (1 g total) by mouth 4 (four) times daily -  with meals and at bedtime.    Note:  This document was prepared using Dragon voice recognition software and may include unintentional dictation errors.  Nanda Quinton, MD Emergency Medicine    Damareon Lanni, [facility], MD 03/27/17 [phone]

## 2017-03-27 NOTE — ED Notes (Signed)
Pt st's slight relief after GI Cocktail.  Pt st's continues to have pain in chest and feels like something in throat

## 2017-03-27 NOTE — ED Triage Notes (Signed)
Pt states that on Friday he began having shortness of breath with pain in left arm. Pt seen his PCP yesterday and they increased his HTN medication. Last night he began having pressure in his chest and into his throat. At this pt feels like he has pressure in his chest and like something is in his throat.

## 2017-03-28 DIAGNOSIS — G4733 Obstructive sleep apnea (adult) (pediatric): Secondary | ICD-10-CM | POA: Diagnosis not present

## 2017-03-28 DIAGNOSIS — R0609 Other forms of dyspnea: Secondary | ICD-10-CM | POA: Diagnosis not present

## 2017-03-28 DIAGNOSIS — R0789 Other chest pain: Secondary | ICD-10-CM | POA: Diagnosis not present

## 2017-03-30 DIAGNOSIS — L918 Other hypertrophic disorders of the skin: Secondary | ICD-10-CM | POA: Diagnosis not present

## 2017-03-30 DIAGNOSIS — R079 Chest pain, unspecified: Secondary | ICD-10-CM | POA: Diagnosis not present

## 2017-04-06 DIAGNOSIS — E291 Testicular hypofunction: Secondary | ICD-10-CM | POA: Diagnosis not present

## 2017-04-10 DIAGNOSIS — R079 Chest pain, unspecified: Secondary | ICD-10-CM | POA: Diagnosis not present

## 2017-04-11 DIAGNOSIS — M2550 Pain in unspecified joint: Secondary | ICD-10-CM | POA: Diagnosis not present

## 2017-04-11 DIAGNOSIS — E291 Testicular hypofunction: Secondary | ICD-10-CM | POA: Diagnosis not present

## 2017-04-11 DIAGNOSIS — G479 Sleep disorder, unspecified: Secondary | ICD-10-CM | POA: Diagnosis not present

## 2017-04-16 ENCOUNTER — Telehealth: Payer: Self-pay | Admitting: Adult Health

## 2017-04-16 DIAGNOSIS — I10 Essential (primary) hypertension: Secondary | ICD-10-CM | POA: Diagnosis not present

## 2017-04-16 DIAGNOSIS — Z13220 Encounter for screening for lipoid disorders: Secondary | ICD-10-CM | POA: Diagnosis not present

## 2017-04-16 DIAGNOSIS — Z0000 Encounter for general adult medical examination without abnormal findings: Secondary | ICD-10-CM | POA: Diagnosis not present

## 2017-04-16 NOTE — Telephone Encounter (Addendum)
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 Hilda Blades in medical records is gone for the day and pts appt is later today.

## 2017-04-16 NOTE — Telephone Encounter (Signed)
CPAP compliance and usage report successfully faxed to [facility] health and wellness as patient requested.

## 2017-04-17 DIAGNOSIS — R6889 Other general symptoms and signs: Secondary | ICD-10-CM | POA: Diagnosis not present

## 2017-04-17 DIAGNOSIS — J069 Acute upper respiratory infection, unspecified: Secondary | ICD-10-CM | POA: Diagnosis not present

## 2017-04-17 DIAGNOSIS — Z20828 Contact with and (suspected) exposure to other viral communicable diseases: Secondary | ICD-10-CM | POA: Diagnosis not present

## 2017-04-18 ENCOUNTER — Encounter: Payer: Self-pay | Admitting: Neurology

## 2017-04-23 ENCOUNTER — Encounter: Payer: Self-pay | Admitting: Neurology

## 2017-04-23 ENCOUNTER — Ambulatory Visit: Payer: 59 | Admitting: Neurology

## 2017-04-23 VITALS — BP 150/82 | HR 48 | Ht 70.0 in | Wt 271.0 lb

## 2017-04-23 DIAGNOSIS — Z9989 Dependence on other enabling machines and devices: Secondary | ICD-10-CM | POA: Diagnosis not present

## 2017-04-23 DIAGNOSIS — G4733 Obstructive sleep apnea (adult) (pediatric): Secondary | ICD-10-CM | POA: Diagnosis not present

## 2017-04-23 DIAGNOSIS — R0789 Other chest pain: Secondary | ICD-10-CM | POA: Diagnosis not present

## 2017-04-23 NOTE — Patient Instructions (Addendum)
Please continue using your CPAP regularly. While your insurance requires that you use CPAP at least 4 hours each night on 70% of the nights, I recommend, that you not skip any nights and use it throughout the night if you can. Getting used to CPAP and staying with the treatment long term does take time and patience and discipline. Untreated obstructive sleep apnea when it is moderate to severe can have an adverse impact on cardiovascular health and raise her risk for heart disease, arrhythmias, hypertension, congestive heart failure, stroke and diabetes. Untreated obstructive sleep apnea causes sleep disruption, nonrestorative sleep, and sleep deprivation. This can have an impact on your day to day functioning and cause daytime sleepiness and impairment of cognitive function, memory loss, mood disturbance, and problems focussing. Using CPAP regularly can improve these symptoms.  Keep up the good work! We can see you in 1 year, you can see one of our nurse practitioners as you are stable. I will see you after that.   You can try Melatonin at night for sleep: take 1 mg to 3 mg, one to 2 hours before your bedtime. You can go up to 5 mg if needed. It is over the counter and comes in pill form, chewable form and spray, if you prefer.

## 2017-04-23 NOTE — Progress Notes (Signed)
Subjective:    Patient ID: Samuel Gonzalez is a 52 y.o. male.  HPI     Interim history:   Mr. Both is a 52 year old right-handed gentleman with an underlying medical history of chest pain, shift work, and obesity, who presents for follow-up consultation of his obstructive sleep apnea, after his recent sleep studies. The patient is unaccompanied today. I last saw him on 08/11/2015, at which time we talked about his sleep study results. He was compliant with CPAP and felt better. He had a left heart cath under Dr. Einar Gip on 04/20/2015 with benign results reported.  He saw Ward Givens on 04/20/2016, at which time he was compliant with CPAP and advised to follow-up routinely in one year.  Today, 04/23/2017: I reviewed his CPAP compliance data from 03/20/2017 through 04/18/2017 which is a total of 30 days, during which time he used his CPAP every night with percent used days greater than 4 hours at 97%, indicating excellent compliance with an average usage of 6 hours and 35 minutes, residual AHI at goal at 0.8 per hour, leak acceptable with the 95th percentile at 7.6 L/m on a pressure of 8 cm with EPR of 3. He reports that he currently works second shift. He has a lot of overtime. He has been compliant with CPAP. He purchased a CPAP cleaning machine some 6 months ago. Nevertheless, he has been battling congestion and recurrent infections and cough recently in the past few months. He has been back to his cardiologist and had additional workup, which was benign. He was also referred to GI and has been treated for reflux disease but recently was advised by his cardiologist to stop the famotidine. He has an upper GI endoscopy pending for today. He has been treated with multiple medications and is finishing up a steroid taper. He had to also be treated prophylactically further fluids his granddaughter was diagnosed with the flu recently.  The patient's allergies, current medications, family history, past  medical history, past social history, past surgical history and problem list were reviewed and updated as appropriate.    Previously:    I first met him on 03/25/2015 at the request of his cardiologist, at which time the patient reported snoring and excessive daytime somnolence. He has been a third shift worker for years. I asked him to return for sleep study. He had a baseline sleep study, followed by a CPAP titration study. I went over his test results with him in detail today. His baseline sleep study from 04/16/2015 showed a sleep efficiency of 92.3%, sleep latency was 5 minutes, wake after sleep onset was 33 minutes with moderate sleep fragmentation noted. He had an increased percentage of REM sleep at 38.3%, REM latency was 54.5 minutes. Of note, the patient is a third shift worker and at the time of the study start he had been awake for over 24 hours as he reported. He had borderline PLMS with an index of 5.7 per hour, with 0.1 arousals per hour. He had no significant EKG changes. Mild to moderate snoring was noted. Total AHI was 7.6 per hour, average oxygen saturation was 91%, nadir was 82%. Time below 90% saturation was 2 hours and 4 minutes. Based on his medical history, and his sleep related complaints I asked him to return for full night CPAP titration study. He had this on 05/07/2015. Sleep efficiency was 92.5%, sleep latency was 5 minutes, wake after sleep onset was 32.5 minutes with mild to moderate sleep fragmentation noted. He  had a normal arousal index. He had slow-wave sleep at 12.8%, and REM sleep was 31.2% with a REM latency of 62 minutes. He had mild PLMS with an index of 17.9 per hour, resulting in 1.6 arousals per hour. He had no significant EKG or EEG changes. He had a total of one central apnea and 8 obstructive hypopneas for the night. CPAP was titrated from 5 cm to 8 cm. AHI was 0.3 per hour at the final pressure with supine REM sleep achieved. Average oxygen saturation was 93%, nadir  was 87%. Time below 90% saturation was 6 minutes and 24 seconds for the night. Based on his test results I prescribed CPAP therapy for home use.   I reviewed his CPAP compliance data from 07/10/2015 through 08/08/2015 which is a total of 30 days during which time he used his machine every night with percent used days greater than 4 hours at 83%, indicating very good compliance with an average usage for all nights of 5 hours and 50 minutes, residual AHI 1.8 per hour, leak acceptable with the 95th percentile at 10.1 L/m on a pressure of 8 cm with EPR of 3.   03/25/2015: He reports snoring and excessive daytime somnolence.    He has worked 3rd shift for years, had a gap for 2 years when he worked days and is back on 3rd shift for the past 6 months. He works in [employer], clinical (histocompatibility and immunogenetics). He works from midnight to 8:30 AM. Bedtime is around 1 to 2 PM and rise time is usually around 8 PM. He lives at home with his wife, one daughter and one granddaughter. He is a nonsmoker. He drinks alcohol infrequently, 2-3 times a month, and caffeine in the form of cola, 2-3 cans per day. He denies restless leg symptoms or nocturia. He denies a family history of OSA. His wife has noted abnormal sounds while he is asleep including snorting. He has occasional morning headaches. He had a cardiac SPECT scan on 03/19/2015 and an echocardiogram on 03/24/2015 with results pending. He has an appointment in your office later on this month. His EKG on 03/11/2015 was reportedly normal per your records. He was started on metoprolol and baby aspirin recently. He was also started on atorvastatin. He had blood work through your office with results pending. He was given a prescription for nitroglycerin which he has not used yet. He denies reflux symptoms at night. His Epworth sleepiness score is 10 out of 24 today, his fatigue score is 22 out of 63. I reviewed your office note from 03/11/2015, which you kindly included.  His Past Medical  History Is Significant For: Past Medical History:  Diagnosis Date  . Chest pain     His Past Surgical History Is Significant For: Past Surgical History:  Procedure Laterality Date  . CARDIAC CATHETERIZATION N/A 04/20/2015   Procedure: Left Heart Cath and Coronary Angiography;  Surgeon: Adrian Prows, MD;  Location: [facility];  Service: Cardiovascular;  Laterality: N/A;    His Family History Is Significant For: Family History  Problem Relation Age of Onset  . Diabetes Mother   . Hypertension Mother   . Heart disease Maternal Grandmother   . Heart disease Maternal Grandfather   . Cancer Paternal Grandmother   . Cancer Paternal Grandfather     His Social History Is Significant For: Social History   Socioeconomic History  . Marital status: Married    Spouse name: None  . Number of children: 2  . Years  of education: College  . Highest education level: None  Social Needs  . Financial resource strain: None  . Food insecurity - worry: None  . Food insecurity - inability: None  . Transportation needs - medical: None  . Transportation needs - non-medical: None  Occupational History  . None  Tobacco Use  . Smoking status: Never Smoker  . Smokeless tobacco: Never Used  Substance and Sexual Activity  . Alcohol use: Yes    Alcohol/week: 0.0 oz  . Drug use: No  . Sexual activity: None  Other Topics Concern  . None  Social History Narrative   Drinks 2-3 cokes a day     His Allergies Are:  Allergies  Allergen Reactions  . Aspirin Hives    Childhood reaction, takes '81mg'$  daily with no issues  . Peanut-Containing Drug Products Other (See Comments)  :   His Current Medications Are:  Outpatient Encounter Medications as of 04/23/2017  Medication Sig  . aspirin 81 MG tablet Take 81 mg by mouth daily.  . famotidine (PEPCID) 40 MG tablet Take 1 tablet (40 mg total) by mouth 2 (two) times daily.  . metoprolol succinate (TOPROL-XL) 50 MG 24 hr tablet Take 50 mg by mouth  daily. Take with or immediately following a meal.  . Multiple Vitamins-Minerals (MULTIVITAMIN WITH MINERALS) tablet Take 1 tablet by mouth daily.  . pantoprazole (PROTONIX) 40 MG tablet Take 1 tablet (40 mg total) by mouth daily.  . rosuvastatin (CRESTOR) 20 MG tablet Take 20 mg by mouth daily.  . sucralfate (CARAFATE) 1 GM/10ML suspension Take 10 mLs (1 g total) by mouth 4 (four) times daily -  with meals and at bedtime.  . [DISCONTINUED] diphenhydrAMINE (BENADRYL) 25 MG tablet Take 50 mg by mouth every 6 (six) hours as needed for allergies.  . [DISCONTINUED] nitroGLYCERIN (NITROSTAT) 0.4 MG SL tablet 1 (ONE) TABLET UNDER TONGUE EVERY MINUTES AS NEEDED FOR CHEST PAIN   No facility-administered encounter medications on file as of 04/23/2017.   :  Review of Systems:  Out of a complete 14 point review of systems, all are reviewed and negative with the exception of these symptoms as listed below: Review of Systems  Neurological:       Pt presents today to discuss his cpap. Pt feels better some mornings. Pt has been struggling with congestion recently.    Objective:  Neurological Exam  Physical Exam Physical Examination:   Vitals:   04/23/17 0806  BP: (!) 150/82  Pulse: (!) 48    General Examination: The patient is a very pleasant 52 y.o. male in no acute distress. He appears well-developed and well-nourished and well groomed.   HEENT: Normocephalic, atraumatic, pupils are equal, round and reactive to light and accommodation. Extraocular tracking is good without limitation to gaze excursion or nystagmus noted. Normal smooth pursuit is noted. Hearing is grossly intact. Face is symmetric with normal facial animation and normal facial sensation. Speech is clear with no dysarthria noted. There is no hypophonia. There is no lip, neck/head, jaw or voice tremor. Neck is supple with full range of passive and active motion. There are no carotid bruits on auscultation. Oropharynx exam reveals: mild  mouth dryness, adequate dental hygiene and marked airway crowding, with mild pharyngeal erythema noted, mild swelling of his uvula. Mallampati is class III. Tongue protrudes centrally and palate elevates symmetrically. Tonsils are 2+ in size.   Chest: Clear to auscultation without wheezing, rhonchi or crackles noted.  Heart: S1+S2+0, regular and normal without murmurs,  rubs or gallops noted.   Abdomen: Soft, non-tender and non-distended with normal bowel sounds appreciated on auscultation.  Extremities: There is no pitting edema in the distal lower extremities bilaterally. Pedal pulses are intact.  Skin: Warm and dry without trophic changes noted. There are no varicose veins.  Musculoskeletal: exam reveals no obvious joint deformities, tenderness or joint swelling or erythema.   Neurologically:  Mental status: The patient is awake, alert and oriented in all 4 spheres. His immediate and remote memory, attention, language skills and fund of knowledge are appropriate. There is no evidence of aphasia, agnosia, apraxia or anomia. Speech is clear with normal prosody and enunciation. Thought process is linear. Mood is normal and affect is normal.  Cranial nerves II - XII are as described above under HEENT exam. Motor exam: Normal bulk, strength and tone is noted. There is no tremor. Fine motor skills and coordination: grossly intact in the UEs and LEs.   Cerebellar testing: No dysmetria or intention tremor. There is no truncal or gait ataxia.  Sensory exam: intact to light touch in the upper and lower extremities.  Gait, station and balance: He stands easily. No veering to one side is noted. No leaning to one side is noted. Posture is age-appropriate and stance is narrow based. Gait shows normal stride length and normal pace. No problems turning are noted.  Assessment and Plan:   In summary, ORRIE SCHUBERT is a very pleasant 52 year old male with an underlying medical history of chest pain,  shift work, and obesity, who presents for follow-up consultation of his obstructive sleep apnea, well established on CPAP therapy of 8 cm. He had sleep study testing in March 2017. He has been fully compliant with treatment and is highly commended for this. He has been battling recurrent infection and congestion in the past 2 months. He has an upper GI endoscopy pending for today. He is advised to be very mindful of the cleaning of his CPAP machine and changes filters on a monthly basis. I placed an order for CPAP related supplies which we will fax to his DME company, [employer]. Physical exam is stable. He is encouraged to try melatonin as he reports some difficulty with sleep initiation, which likely is exacerbated by his shift work. He is encouraged to incorporate on the interim right into his diet for their anti-inflammatory and antiepileptic properties. I suggested a one-year checkup routinely. He can feed Megan next time again. I answered all his questions today and he was in agreement. I spent 20 minutes in total face-to-face time with the patient, more than 50% of which was spent in counseling and coordination of care, reviewing test results, reviewing medication and discussing or reviewing the diagnosis of OSA, its prognosis and treatment options. Pertinent laboratory and imaging test results that were available during this visit with the patient were reviewed by me and considered in my medical decision making (see chart for details).

## 2017-07-18 DIAGNOSIS — R1031 Right lower quadrant pain: Secondary | ICD-10-CM | POA: Diagnosis not present

## 2017-07-18 DIAGNOSIS — K4090 Unilateral inguinal hernia, without obstruction or gangrene, not specified as recurrent: Secondary | ICD-10-CM | POA: Diagnosis not present

## 2017-07-25 DIAGNOSIS — E291 Testicular hypofunction: Secondary | ICD-10-CM | POA: Diagnosis not present

## 2017-07-26 DIAGNOSIS — K4090 Unilateral inguinal hernia, without obstruction or gangrene, not specified as recurrent: Secondary | ICD-10-CM | POA: Diagnosis not present

## 2017-07-26 DIAGNOSIS — G4733 Obstructive sleep apnea (adult) (pediatric): Secondary | ICD-10-CM | POA: Diagnosis not present

## 2017-07-26 DIAGNOSIS — E669 Obesity, unspecified: Secondary | ICD-10-CM | POA: Diagnosis not present

## 2017-08-07 DIAGNOSIS — K4090 Unilateral inguinal hernia, without obstruction or gangrene, not specified as recurrent: Secondary | ICD-10-CM | POA: Diagnosis not present

## 2017-08-22 DIAGNOSIS — E291 Testicular hypofunction: Secondary | ICD-10-CM | POA: Diagnosis not present

## 2017-08-24 DIAGNOSIS — G4733 Obstructive sleep apnea (adult) (pediatric): Secondary | ICD-10-CM | POA: Diagnosis not present

## 2017-08-29 DIAGNOSIS — E291 Testicular hypofunction: Secondary | ICD-10-CM | POA: Diagnosis not present

## 2017-09-05 DIAGNOSIS — E291 Testicular hypofunction: Secondary | ICD-10-CM | POA: Diagnosis not present

## 2017-09-12 DIAGNOSIS — E291 Testicular hypofunction: Secondary | ICD-10-CM | POA: Diagnosis not present

## 2017-10-17 DIAGNOSIS — I10 Essential (primary) hypertension: Secondary | ICD-10-CM | POA: Diagnosis not present

## 2017-10-17 DIAGNOSIS — G4733 Obstructive sleep apnea (adult) (pediatric): Secondary | ICD-10-CM | POA: Diagnosis not present

## 2017-10-17 DIAGNOSIS — E785 Hyperlipidemia, unspecified: Secondary | ICD-10-CM | POA: Diagnosis not present

## 2017-10-18 DIAGNOSIS — E291 Testicular hypofunction: Secondary | ICD-10-CM | POA: Diagnosis not present

## 2017-10-31 DIAGNOSIS — E291 Testicular hypofunction: Secondary | ICD-10-CM | POA: Diagnosis not present

## 2017-11-07 DIAGNOSIS — E291 Testicular hypofunction: Secondary | ICD-10-CM | POA: Diagnosis not present

## 2017-11-14 DIAGNOSIS — E291 Testicular hypofunction: Secondary | ICD-10-CM | POA: Diagnosis not present

## 2017-11-28 DIAGNOSIS — E291 Testicular hypofunction: Secondary | ICD-10-CM | POA: Diagnosis not present

## 2017-12-03 DIAGNOSIS — G479 Sleep disorder, unspecified: Secondary | ICD-10-CM | POA: Diagnosis not present

## 2017-12-03 DIAGNOSIS — E291 Testicular hypofunction: Secondary | ICD-10-CM | POA: Diagnosis not present

## 2017-12-03 DIAGNOSIS — M2550 Pain in unspecified joint: Secondary | ICD-10-CM | POA: Diagnosis not present

## 2017-12-05 DIAGNOSIS — G479 Sleep disorder, unspecified: Secondary | ICD-10-CM | POA: Diagnosis not present

## 2017-12-05 DIAGNOSIS — M2550 Pain in unspecified joint: Secondary | ICD-10-CM | POA: Diagnosis not present

## 2017-12-05 DIAGNOSIS — E291 Testicular hypofunction: Secondary | ICD-10-CM | POA: Diagnosis not present

## 2017-12-25 DIAGNOSIS — G4733 Obstructive sleep apnea (adult) (pediatric): Secondary | ICD-10-CM | POA: Diagnosis not present

## 2017-12-26 DIAGNOSIS — E291 Testicular hypofunction: Secondary | ICD-10-CM | POA: Diagnosis not present

## 2018-01-02 DIAGNOSIS — E291 Testicular hypofunction: Secondary | ICD-10-CM | POA: Diagnosis not present

## 2018-01-23 DIAGNOSIS — E291 Testicular hypofunction: Secondary | ICD-10-CM | POA: Diagnosis not present

## 2018-02-13 DIAGNOSIS — E291 Testicular hypofunction: Secondary | ICD-10-CM | POA: Diagnosis not present

## 2018-02-20 DIAGNOSIS — E291 Testicular hypofunction: Secondary | ICD-10-CM | POA: Diagnosis not present

## 2018-03-13 DIAGNOSIS — J111 Influenza due to unidentified influenza virus with other respiratory manifestations: Secondary | ICD-10-CM | POA: Diagnosis not present

## 2018-03-13 DIAGNOSIS — I10 Essential (primary) hypertension: Secondary | ICD-10-CM | POA: Diagnosis not present

## 2018-03-13 DIAGNOSIS — J029 Acute pharyngitis, unspecified: Secondary | ICD-10-CM | POA: Diagnosis not present

## 2018-03-20 DIAGNOSIS — E291 Testicular hypofunction: Secondary | ICD-10-CM | POA: Diagnosis not present

## 2018-03-27 DIAGNOSIS — G4733 Obstructive sleep apnea (adult) (pediatric): Secondary | ICD-10-CM | POA: Diagnosis not present

## 2018-03-29 DIAGNOSIS — R1031 Right lower quadrant pain: Secondary | ICD-10-CM | POA: Diagnosis not present

## 2018-04-03 DIAGNOSIS — E291 Testicular hypofunction: Secondary | ICD-10-CM | POA: Diagnosis not present

## 2018-04-09 ENCOUNTER — Telehealth: Payer: Self-pay

## 2018-04-09 NOTE — Telephone Encounter (Signed)
I called pt to discuss his DoT paperwork. Dr. Rexene Alberts wants to know if he feels well rested or feels sleepy during the day. No answer, left a message asking him to call me back.

## 2018-04-10 DIAGNOSIS — E291 Testicular hypofunction: Secondary | ICD-10-CM | POA: Diagnosis not present

## 2018-04-10 DIAGNOSIS — R1031 Right lower quadrant pain: Secondary | ICD-10-CM | POA: Diagnosis not present

## 2018-04-10 NOTE — Telephone Encounter (Signed)
Pt has called and in response to question from Dr Rexene Alberts no, he does not feel sleepy or tired during the day.  Pt states the 30 day print out is needed and it needs to be sent to [facility] in [place].  Pt states the current print out has not been sent yet, just the one from last year

## 2018-04-10 NOTE — Telephone Encounter (Signed)
[employer] employee physical paperwork filled out for ongoing good CPAP compliance; settings are appropriate. No significant residual sleep-disordered breathing, average usage of 7+ hours, no significant leak from the mask. Compliance for more than 4 hour usage is excellent at 97% for the past month.

## 2018-04-10 NOTE — Telephone Encounter (Signed)
Form and cpap compliance data faxed back to [facility] fax number provided. Received a receipt of confirmation.

## 2018-04-17 DIAGNOSIS — E785 Hyperlipidemia, unspecified: Secondary | ICD-10-CM | POA: Diagnosis not present

## 2018-04-17 DIAGNOSIS — I10 Essential (primary) hypertension: Secondary | ICD-10-CM | POA: Diagnosis not present

## 2018-04-17 DIAGNOSIS — E291 Testicular hypofunction: Secondary | ICD-10-CM | POA: Diagnosis not present

## 2018-04-17 DIAGNOSIS — R972 Elevated prostate specific antigen [PSA]: Secondary | ICD-10-CM | POA: Diagnosis not present

## 2018-04-25 ENCOUNTER — Ambulatory Visit: Payer: 59 | Admitting: Adult Health

## 2018-04-30 ENCOUNTER — Ambulatory Visit: Payer: 59 | Admitting: Physical Therapy

## 2018-06-12 DIAGNOSIS — E291 Testicular hypofunction: Secondary | ICD-10-CM | POA: Diagnosis not present

## 2018-06-19 DIAGNOSIS — E291 Testicular hypofunction: Secondary | ICD-10-CM | POA: Diagnosis not present

## 2018-06-26 DIAGNOSIS — G4733 Obstructive sleep apnea (adult) (pediatric): Secondary | ICD-10-CM | POA: Diagnosis not present

## 2018-06-27 DIAGNOSIS — E291 Testicular hypofunction: Secondary | ICD-10-CM | POA: Diagnosis not present

## 2018-07-03 DIAGNOSIS — E291 Testicular hypofunction: Secondary | ICD-10-CM | POA: Diagnosis not present

## 2018-07-04 ENCOUNTER — Telehealth: Payer: Self-pay | Admitting: *Deleted

## 2018-07-04 NOTE — Telephone Encounter (Addendum)
Due to current COVID 19 pandemic, our office is severely reducing in office visits until further notice, in order to minimize the risk to our patients and healthcare providers. Unable to get in contact with the patient to convert their office visit with [facility] on 6/2//2020 into a doxy.me visit. I left a voicemail asking the patient to return my call. Office number was provided.     If patient calls back please convert their office visit into a doxy[employer]me visit.

## 2018-07-09 ENCOUNTER — Ambulatory Visit: Payer: 59 | Admitting: Adult Health

## 2018-07-09 NOTE — Telephone Encounter (Signed)
LMVM for pt that did not hear back from him, so will be cancelling appt.  Please call back to reschedule.

## 2018-07-22 ENCOUNTER — Telehealth: Payer: Self-pay | Admitting: Adult Health

## 2018-07-22 ENCOUNTER — Encounter: Payer: Self-pay | Admitting: Adult Health

## 2018-07-22 NOTE — Telephone Encounter (Signed)
Noted  

## 2018-07-22 NOTE — Telephone Encounter (Signed)
Pt consented to a Virtual Visit. Link has been sent to email in chart.  Pt understands that although there may be some limitations with this type of visit, we will take all precautions to reduce any security or privacy concerns.  Pt understands that this will be treated like an in office visit and we will file with pt's insurance, and there may be a patient responsible charge related to this service.

## 2018-07-24 ENCOUNTER — Ambulatory Visit (INDEPENDENT_AMBULATORY_CARE_PROVIDER_SITE_OTHER): Payer: 59 | Admitting: Adult Health

## 2018-07-24 ENCOUNTER — Other Ambulatory Visit: Payer: Self-pay

## 2018-07-24 DIAGNOSIS — G4733 Obstructive sleep apnea (adult) (pediatric): Secondary | ICD-10-CM | POA: Diagnosis not present

## 2018-07-24 DIAGNOSIS — Z9989 Dependence on other enabling machines and devices: Secondary | ICD-10-CM | POA: Diagnosis not present

## 2018-07-24 NOTE — Progress Notes (Signed)
  [facility] [address] [place]. [place] [address] [phone]     Virtual Visit via Telephone Note  I connected with Samuel Gonzalez on 07/24/18 at  8:30 AM EDT by telephone located remotely at [facility] and verified that I am speaking with the correct person using two identifiers who reports being located at home.   Visit scheduled by Ward Givens. I discussed the limitations, risks, security and privacy concerns of performing an evaluation and management service by telephone and the availability of in person appointments. I also discussed with the patient that there may be a patient responsible charge related to this service. The patient expressed understanding and agreed to proceed.   History of Present Illness:  Samuel Gonzalez is a 53 y.o. male who has been followed in this office for obstructive sleep apnea on CPAP he was initially scheduled for face-to-face office follow up visit today time but due to [place], visit rescheduled for non-face-to-face telephone visit with patients consent. Unable to participate in video visit due to lack of access to device with camera.     Today 07/24/18 Samuel Gonzalez is a 53 year old male with a history of obstructive sleep apnea on CPAP.  He returns today for follow-up.  His CPAP download indicates that he uses machine nightly and greater than 4 hours each night.  On average he uses his machine 6 hours and 18 minutes.  His residual AHI is 1 on 8 cm of water with EPR of 3.  He does not have a significant leak.  Overall he is doing well.  He denies any new symptoms.  Denies any daytime sleepiness.   Observations/Objective:   Neurological examination  Mentation: Alert oriented to time, place, history taking.  speech and language fluent  Assessment and Plan:  1.  Obstructive sleep apnea on CPAP  The patient CPAP download shows excellent compliance and good treatment of his apnea.  He is encouraged to  continue using the CPAP nightly and greater than 4 hours each night.  He is advised that if his symptoms worsen or he develops new symptoms he should let us know.  He will follow-up in 1 year or sooner if needed.  Follow Up Instructions:    Follow-up in 1 year   I discussed the assessment and treatment plan with the patient.  The patient was provided an opportunity to ask questions and all were answered to their satisfaction. The patient agreed with the plan and verbalized an understanding of the instructions.   I provided 10 minutes of non-face-to-face time during this encounter.         Ward Givens, MSN, NP-C 07/24/2018, 8:35 AM [facility] [address] [place], [place] [address] [phone]

## 2019-03-24 ENCOUNTER — Telehealth: Payer: Self-pay | Admitting: Adult Health

## 2019-03-24 NOTE — Telephone Encounter (Signed)
Pt has called asking that his Compliance Report be faxed to his employer [phone] attention John.  Pt is asking for a call from Medical Records once the form has been faxed

## 2019-07-21 ENCOUNTER — Telehealth: Payer: Self-pay | Admitting: Adult Health

## 2019-07-21 NOTE — Telephone Encounter (Signed)
Pt gave consent for insurance to be filed for vv  Pt understands that although there may be some limitations with this type of visit, we will take all precautions to reduce any security or privacy concerns.  Pt understands that this will be treated like an in office visit and we will file with pt's insurance, and there may be a patient responsible charge related to this service.  

## 2019-07-21 NOTE — Telephone Encounter (Signed)
Noted  

## 2019-07-24 ENCOUNTER — Telehealth: Payer: Self-pay | Admitting: Adult Health

## 2019-07-24 ENCOUNTER — Telehealth: Payer: 59 | Admitting: Adult Health

## 2019-07-24 NOTE — Telephone Encounter (Signed)
Noted  

## 2019-07-24 NOTE — Telephone Encounter (Signed)
FYI pt has called to reschedule his CPAP f/u.  Pt did not want to accept Megan,NP's next available which is in the month of OCT.  Pt accepted next available for Amy,NP and he is also on the wait list.  Pt declined a my chart and said he will just come in office.  This is FYI no call back requested

## 2019-08-05 NOTE — Telephone Encounter (Signed)
Error

## 2019-10-01 ENCOUNTER — Encounter: Payer: Self-pay | Admitting: Family Medicine

## 2019-10-01 ENCOUNTER — Ambulatory Visit: Payer: 59 | Admitting: Family Medicine

## 2019-10-01 VITALS — BP 140/87 | HR 51 | Ht 70.0 in | Wt 282.0 lb

## 2019-10-01 DIAGNOSIS — Z9989 Dependence on other enabling machines and devices: Secondary | ICD-10-CM | POA: Diagnosis not present

## 2019-10-01 DIAGNOSIS — G4733 Obstructive sleep apnea (adult) (pediatric): Secondary | ICD-10-CM | POA: Diagnosis not present

## 2019-10-01 NOTE — Progress Notes (Addendum)
PATIENT: Samuel Gonzalez DOB: Feb 19, 1965  REASON FOR VISIT: follow up HISTORY FROM: patient  Chief Complaint  Patient presents with  . Follow-up    F/U on CPAP. States he has noticed increased sleepiness 2 months ago.   . room 2    alone     HISTORY OF PRESENT ILLNESS: Today 10/01/19 Samuel Gonzalez is a 54 y.o. male here today for follow up for OSA on CPAP. He is doing well on therapy. He does have more fatigue recently. He reports working 12 hour shifts 7 days a week due to shortages. He admits that he has gained some weight. Stress is more of a concern recently.   Compliance report dated 08/31/2019 through 09/29/2019 reveals that he used CPAP 30 of the past 30 days for compliance of 100%.  He used CPAP greater than 4 hours all 30 days.  Average usage was 6 hours and 29 minutes.  Residual AHI was 1.0 on 8 cm of water and an EPR of 3.  There was no leak noted.    HISTORY: (copied from [facility] note on 07/24/2018)  Samuel Gonzalez is a 54 y.o. male who has been followed in this office for obstructive sleep apnea on CPAP he was initially scheduled for face-to-face office follow up visit today time but due to [place] rescheduled for non-face-to-face telephone visit with patients consent. Unable to participate in video visit due to lack of access to device with camera.    Today 07/24/18 Samuel Gonzalez is a 54 year old male with a history of obstructive sleep apnea on CPAP.  He returns today for follow-up.  His CPAP download indicates that he uses machine nightly and greater than 4 hours each night.  On average he uses his machine 6 hours and 18 minutes.  His residual AHI is 1 on 8 cm of water with EPR of 3.  He does not have a significant leak.  Overall he is doing well.  He denies any new symptoms.  Denies any daytime sleepiness.   REVIEW OF SYSTEMS: Out of a complete 14 system review of symptoms, the patient complains only of the following symptoms, fatigue, weight gain and  all other reviewed systems are negative.  ESS: 7 FSS: 28  ALLERGIES: Allergies  Allergen Reactions  . Aspirin Hives    Childhood reaction, takes 81mg  daily with no issues  . Peanut-Containing Drug Products Other (See Comments)    HOME MEDICATIONS: Outpatient Medications Prior to Visit  Medication Sig Dispense Refill  . metoprolol succinate (TOPROL-XL) 50 MG 24 hr tablet Take 50 mg by mouth daily. Take with or immediately following a meal.    . pantoprazole (PROTONIX) 40 MG tablet Take 1 tablet (40 mg total) by mouth daily. 30 tablet 0  . rosuvastatin (CRESTOR) 20 MG tablet Take 20 mg by mouth daily.  5  . aspirin 81 MG tablet Take 81 mg by mouth daily.    . famotidine (PEPCID) 40 MG tablet Take 1 tablet (40 mg total) by mouth 2 (two) times daily. 60 tablet 1  . Multiple Vitamins-Minerals (MULTIVITAMIN WITH MINERALS) tablet Take 1 tablet by mouth daily.    . sucralfate (CARAFATE) 1 GM/10ML suspension Take 10 mLs (1 g total) by mouth 4 (four) times daily -  with meals and at bedtime. 420 mL 0   No facility-administered medications prior to visit.    PAST MEDICAL HISTORY: Past Medical History:  Diagnosis Date  . Chest pain     PAST  SURGICAL HISTORY: Past Surgical History:  Procedure Laterality Date  . CARDIAC CATHETERIZATION N/A 04/20/2015   Procedure: Left Heart Cath and Coronary Angiography;  Surgeon: Adrian Prows, MD;  Location: [facility];  Service: Cardiovascular;  Laterality: N/A;    FAMILY HISTORY: Family History  Problem Relation Age of Onset  . Diabetes Mother   . Hypertension Mother   . Heart disease Maternal Grandmother   . Heart disease Maternal Grandfather   . Cancer Paternal Grandmother   . Cancer Paternal Grandfather     SOCIAL HISTORY: Social History   Socioeconomic History  . Marital status: Married    Spouse name: Not on file  . Number of children: 2  . Years of education: College  . Highest education level: Not on file  Occupational  History  . Not on file  Tobacco Use  . Smoking status: Never Smoker  . Smokeless tobacco: Never Used  Substance and Sexual Activity  . Alcohol use: Yes    Alcohol/week: 0.0 standard drinks  . Drug use: No  . Sexual activity: Not on file  Other Topics Concern  . Not on file  Social History Narrative   Drinks 2-3 cokes a day    Social Determinants of Health   Financial Resource Strain:   . Difficulty of Paying Living Expenses: Not on file  Food Insecurity:   . Worried About Charity fundraiser in the Last Year: Not on file  . Ran Out of Food in the Last Year: Not on file  Transportation Needs:   . Lack of Transportation (Medical): Not on file  . Lack of Transportation (Non-Medical): Not on file  Physical Activity:   . Days of Exercise per Week: Not on file  . Minutes of Exercise per Session: Not on file  Stress:   . Feeling of Stress : Not on file  Social Connections:   . Frequency of Communication with Friends and Family: Not on file  . Frequency of Social Gatherings with Friends and Family: Not on file  . Attends Religious Services: Not on file  . Active Member of Clubs or Organizations: Not on file  . Attends Archivist Meetings: Not on file  . Marital Status: Not on file  Intimate Partner Violence:   . Fear of Current or Ex-Partner: Not on file  . Emotionally Abused: Not on file  . Physically Abused: Not on file  . Sexually Abused: Not on file      PHYSICAL EXAM  Vitals:   10/01/19 1313  BP: 140/87  Pulse: (!) 51  Weight: 282 lb (127.9 kg)  Height: 5\' 10"  (1.778 m)   Body mass index is 40.46 kg/m.  Generalized: Well developed, in no acute distress  Cardiology: normal rate and rhythm, no murmur noted Respiratory: clear to auscultation bilaterally  Neurological examination  Mentation: Alert oriented to time, place, history taking. Follows all commands speech and language fluent Cranial nerve II-XII: Pupils were equal round reactive to light.  Extraocular movements were full, visual field were full  Motor: The motor testing reveals 5 over 5 strength of all 4 extremities. Good symmetric motor tone is noted throughout.  Gait and station: Gait is normal.    DIAGNOSTIC DATA (LABS, IMAGING, TESTING) - I reviewed patient records, labs, notes, testing and imaging myself where available.  No flowsheet data found.   Lab Results  Component Value Date   WBC 15.6 (H) 03/27/2017   HGB 17.8 (H) 03/27/2017   HCT 50.7 03/27/2017  MCV 87.7 03/27/2017   PLT 255 03/27/2017      Component Value Date/Time   NA 139 03/27/2017 0920   K 3.6 03/27/2017 0920   CL 101 03/27/2017 0920   CO2 25 03/27/2017 0920   GLUCOSE 127 (H) 03/27/2017 0920   BUN 10 03/27/2017 0920   CREATININE 1.13 03/27/2017 0920   CALCIUM 9.5 03/27/2017 0920   GFRNONAA >60 03/27/2017 0920   GFRAA >60 03/27/2017 0920   No results found for: CHOL, HDL, LDLCALC, LDLDIRECT, TRIG, CHOLHDL No results found for: HGBA1C No results found for: VITAMINB12 No results found for: TSH     ASSESSMENT AND PLAN 54 y.o. year old male  has a past medical history of Chest pain. here with     ICD-10-CM   1. OSA on CPAP  G47.33    Z99.89     Samuel Gonzalez is doing very well on CPAP therapy.  Compliance report reveals excellent compliance.  He was encouraged to continue using CPAP nightly and for greater than 4 hours each night.  We have discussed his concerns of fatigue.  I am suspicious that there are multiple lifestyle factors contributing.  He was encouraged to focus on healthy lifestyle habits.  Adequate hydration, sleep hygiene, well-balanced diet and regular exercise advised.  He will continue to follow-up closely with primary care.  He will keep an eye on his blood pressures at home.  He will follow-up with [place] in 1 year, sooner if needed.  He verbalizes understanding and agreement with this plan.    No orders of the defined types were placed in this encounter.    No orders of  the defined types were placed in this encounter.     I spent 15 minutes with the patient. 50% of this time was spent counseling and educating patient on plan of care and medications.    Debbora Presto, FNP-C 10/01/2019, 1:23 PM [facility] [address], [place], [place] [address] [phone]  I reviewed the above note and documentation by the Nurse Practitioner and agree with the history, exam, assessment and plan as outlined above. I was available for consultation. Star Age, MD, PhD [facility])

## 2019-10-01 NOTE — Patient Instructions (Signed)
Please continue using your CPAP regularly. While your insurance requires that you use CPAP at least 4 hours each night on 70% of the nights, I recommend, that you not skip any nights and use it throughout the night if you can. Getting used to CPAP and staying with the treatment long term does take time and patience and discipline. Untreated obstructive sleep apnea when it is moderate to severe can have an adverse impact on cardiovascular health and raise her risk for heart disease, arrhythmias, hypertension, congestive heart failure, stroke and diabetes. Untreated obstructive sleep apnea causes sleep disruption, nonrestorative sleep, and sleep deprivation. This can have an impact on your day to day functioning and cause daytime sleepiness and impairment of cognitive function, memory loss, mood disturbance, and problems focussing. Using CPAP regularly can improve these symptoms.   Follow up in 1 year   Sleep Apnea Sleep apnea affects breathing during sleep. It causes breathing to stop for a short time or to become shallow. It can also increase the risk of:  Heart attack.  Stroke.  Being very overweight (obese).  Diabetes.  Heart failure.  Irregular heartbeat. The goal of treatment is to help you breathe normally again. What are the causes? There are three kinds of sleep apnea:  Obstructive sleep apnea. This is caused by a blocked or collapsed airway.  Central sleep apnea. This happens when the brain does not send the right signals to the muscles that control breathing.  Mixed sleep apnea. This is a combination of obstructive and central sleep apnea. The most common cause of this condition is a collapsed or blocked airway. This can happen if:  Your throat muscles are too relaxed.  Your tongue and tonsils are too large.  You are overweight.  Your airway is too small. What increases the risk?  Being overweight.  Smoking.  Having a small airway.  Being older.  Being  male.  Drinking alcohol.  Taking medicines to calm yourself (sedatives or tranquilizers).  Having family members with the condition. What are the signs or symptoms?  Trouble staying asleep.  Being sleepy or tired during the day.  Getting angry a lot.  Loud snoring.  Headaches in the morning.  Not being able to focus your mind (concentrate).  Forgetting things.  Less interest in sex.  Mood swings.  Personality changes.  Feelings of sadness (depression).  Waking up a lot during the night to pee (urinate).  Dry mouth.  Sore throat. How is this diagnosed?  Your medical history.  A physical exam.  A test that is done when you are sleeping (sleep study). The test is most often done in a sleep lab but may also be done at home. How is this treated?   Sleeping on your side.  Using a medicine to get rid of mucus in your nose (decongestant).  Avoiding the use of alcohol, medicines to help you relax, or certain pain medicines (narcotics).  Losing weight, if needed.  Changing your diet.  Not smoking.  Using a machine to open your airway while you sleep, such as: ? An oral appliance. This is a mouthpiece that shifts your lower jaw forward. ? A CPAP device. This device blows air through a mask when you breathe out (exhale). ? An EPAP device. This has valves that you put in each nostril. ? A BPAP device. This device blows air through a mask when you breathe in (inhale) and breathe out.  Having surgery if other treatments do not work. It is   important to get treatment for sleep apnea. Without treatment, it can lead to:  High blood pressure.  Coronary artery disease.  In men, not being able to have an erection (impotence).  Reduced thinking ability. Follow these instructions at home: Lifestyle  Make changes that your doctor recommends.  Eat a healthy diet.  Lose weight if needed.  Avoid alcohol, medicines to help you relax, and some pain  medicines.  Do not use any products that contain nicotine or tobacco, such as cigarettes, e-cigarettes, and chewing tobacco. If you need help quitting, ask your doctor. General instructions  Take over-the-counter and prescription medicines only as told by your doctor.  If you were given a machine to use while you sleep, use it only as told by your doctor.  If you are having surgery, make sure to tell your doctor you have sleep apnea. You may need to bring your device with you.  Keep all follow-up visits as told by your doctor. This is important. Contact a doctor if:  The machine that you were given to use during sleep bothers you or does not seem to be working.  You do not get better.  You get worse. Get help right away if:  Your chest hurts.  You have trouble breathing in enough air.  You have an uncomfortable feeling in your back, arms, or stomach.  You have trouble talking.  One side of your body feels weak.  A part of your face is hanging down. These symptoms may be an emergency. Do not wait to see if the symptoms will go away. Get medical help right away. Call your local emergency services (911 in the [place] Do not drive yourself to the hospital. Summary  This condition affects breathing during sleep.  The most common cause is a collapsed or blocked airway.  The goal of treatment is to help you breathe normally while you sleep. This information is not intended to replace advice given to you by your health care provider. Make sure you discuss any questions you have with your health care provider. Document Revised: 11/09/2017 Document Reviewed: 09/18/2017 Elsevier Patient Education  2020 [employer] Inc[employer]  

## 2020-09-29 ENCOUNTER — Telehealth: Payer: Self-pay

## 2020-09-29 NOTE — Telephone Encounter (Signed)
Called and lvm regarding tomorrows CPAP f/u w/ Amy,NP. Last download was on 05/05/20. Calling to see if pt has recently used CPAP, if so he will need to bring CPAP and power cord to appt.

## 2020-09-29 NOTE — Telephone Encounter (Signed)
error 

## 2020-09-29 NOTE — Patient Instructions (Addendum)
Please continue using your CPAP regularly. While your insurance requires that you use CPAP at least 4 hours each night on 70% of the nights, I recommend, that you not skip any nights and use it throughout the night if you can. Getting used to CPAP and staying with the treatment long term does take time and patience and discipline. Untreated obstructive sleep apnea when it is moderate to severe can have an adverse impact on cardiovascular health and raise her risk for heart disease, arrhythmias, hypertension, congestive heart failure, stroke and diabetes. Untreated obstructive sleep apnea causes sleep disruption, nonrestorative sleep, and sleep deprivation. This can have an impact on your day to day functioning and cause daytime sleepiness and impairment of cognitive function, memory loss, mood disturbance, and problems focussing. Using CPAP regularly can improve these symptoms.   Follow up in 31-90 days following new CPAP set up date. You can call the office once you get your new machine.

## 2020-09-29 NOTE — Progress Notes (Addendum)
PATIENT: Samuel Gonzalez DOB: 1965/06/07  REASON FOR VISIT: follow up HISTORY FROM: patient  Chief Complaint  Patient presents with   Follow-up    RM 1, alone. Last seen 10/01/19.      HISTORY OF PRESENT ILLNESS: 09/30/20 ALL: Samuel Gonzalez returns for follow up for OSA on CPAP. He is doing well on therapy. He works 12 hour shifts 6-7 days a week and stays tired. He does feel that he is sleeping well. He wakes feeling rested. He is having some difficulty with the seal on his water chamber. Current machine is just over 89 years old.   Compliance report dated 09/01/2020-09/30/2020 reveals he has used CPAP 28/30 days for greater than 4 hours (93%). Average use was 7 hours. Residual AHI was 0.9 on set pressure of 8cmH20. No significant leak noted.    10/01/2019 ALL:  Samuel Gonzalez is a 55 y.o. male here today for follow up for OSA on CPAP. He is doing well on therapy. He does have more fatigue recently. He reports working 12 hour shifts 7 days a week due to shortages. He admits that he has gained some weight. Stress is more of a concern recently.   Compliance report dated 08/31/2019 through 09/29/2019 reveals that he used CPAP 30 of the past 30 days for compliance of 100%.  He used CPAP greater than 4 hours all 30 days.  Average usage was 6 hours and 29 minutes.  Residual AHI was 1.0 on 8 cm of water and an EPR of 3.  There was no leak noted.   HISTORY: (copied from [facility] note on 07/24/2018)  Samuel Gonzalez is a 55 y.o. male who has been followed in this office for obstructive sleep apnea on CPAP he was initially scheduled for face-to-face office follow up visit today time but due to [place], visit rescheduled for non-face-to-face telephone visit with patients consent. Unable to participate in video visit due to lack of access to device with camera.     Today 07/24/18 Samuel Gonzalez is a 55 year old male with a history of obstructive sleep apnea on CPAP.  He returns today for follow-up.  His  CPAP download indicates that he uses machine nightly and greater than 4 hours each night.  On average he uses his machine 6 hours and 18 minutes.  His residual AHI is 1 on 8 cm of water with EPR of 3.  He does not have a significant leak.  Overall he is doing well.  He denies any new symptoms.  Denies any daytime sleepiness.   REVIEW OF SYSTEMS: Out of a complete 14 system review of symptoms, the patient complains only of the following symptoms, fatigue, weight gain and all other reviewed systems are negative.  ESS: 4  ALLERGIES: Allergies  Allergen Reactions   Aspirin Hives    Childhood reaction, takes '81mg'$  daily with no issues   Peanut-Containing Drug Products Other (See Comments)    HOME MEDICATIONS: Outpatient Medications Prior to Visit  Medication Sig Dispense Refill   metoprolol succinate (TOPROL-XL) 50 MG 24 hr tablet Take 50 mg by mouth daily. Take with or immediately following a meal.     rosuvastatin (CRESTOR) 20 MG tablet Take 20 mg by mouth daily.  5   pantoprazole (PROTONIX) 40 MG tablet Take 1 tablet (40 mg total) by mouth daily. 30 tablet 0   No facility-administered medications prior to visit.    PAST MEDICAL HISTORY: Past Medical History:  Diagnosis Date   Chest  pain     PAST SURGICAL HISTORY: Past Surgical History:  Procedure Laterality Date   CARDIAC CATHETERIZATION N/A 04/20/2015   Procedure: Left Heart Cath and Coronary Angiography;  Surgeon: Adrian Prows, MD;  Location: [place] [facility];  Service: Cardiovascular;  Laterality: N/A;    FAMILY HISTORY: Family History  Problem Relation Age of Onset   Diabetes Mother    Hypertension Mother    Heart disease Maternal Grandmother    Heart disease Maternal Grandfather    Cancer Paternal Grandmother    Cancer Paternal Grandfather     SOCIAL HISTORY: Social History   Socioeconomic History   Marital status: Married    Spouse name: Not on file   Number of children: 2   Years of education: College    Highest education level: Not on file  Occupational History   Not on file  Tobacco Use   Smoking status: Never   Smokeless tobacco: Never  Substance and Sexual Activity   Alcohol use: Yes    Alcohol/week: 0.0 standard drinks   Drug use: No   Sexual activity: Not on file  Other Topics Concern   Not on file  Social History Narrative   Drinks 2-3 cokes a day    Social Determinants of Health   Financial Resource Strain: Not on file  Food Insecurity: Not on file  Transportation Needs: Not on file  Physical Activity: Not on file  Stress: Not on file  Social Connections: Not on file  Intimate Partner Violence: Not on file      PHYSICAL EXAM  Vitals:   09/30/20 1254  BP: (!) 149/87  Pulse: (!) 54  Weight: 258 lb (117 kg)  Height: '5\' 10"'$  (1.778 m)    Body mass index is 37.02 kg/m.  Generalized: Well developed, in no acute distress  Cardiology: normal rate and rhythm, no murmur noted Respiratory: clear to auscultation bilaterally  Neurological examination  Mentation: Alert oriented to time, place, history taking. Follows all commands speech and language fluent Cranial nerve II-XII: Pupils were equal round reactive to light. Extraocular movements were full, visual field were full  Motor: The motor testing reveals 5 over 5 strength of all 4 extremities. Good symmetric motor tone is noted throughout.  Gait and station: Gait is normal.    DIAGNOSTIC DATA (LABS, IMAGING, TESTING) - I reviewed patient records, labs, notes, testing and imaging myself where available.  No flowsheet data found.   Lab Results  Component Value Date   WBC 15.6 (H) 03/27/2017   HGB 17.8 (H) 03/27/2017   HCT 50.7 03/27/2017   MCV 87.7 03/27/2017   PLT 255 03/27/2017      Component Value Date/Time   NA 139 03/27/2017 0920   K 3.6 03/27/2017 0920   CL 101 03/27/2017 0920   CO2 25 03/27/2017 0920   GLUCOSE 127 (H) 03/27/2017 0920   BUN 10 03/27/2017 0920   CREATININE 1.13 03/27/2017 0920    CALCIUM 9.5 03/27/2017 0920   GFRNONAA >60 03/27/2017 0920   GFRAA >60 03/27/2017 0920   No results found for: CHOL, HDL, LDLCALC, LDLDIRECT, TRIG, CHOLHDL No results found for: HGBA1C No results found for: VITAMINB12 No results found for: TSH     ASSESSMENT AND PLAN 55 y.o. year old male  has a past medical history of Chest pain. here with     ICD-10-CM   1. OSA on CPAP  G47.33 For home use only DME continuous positive airway pressure (CPAP)   Z99.89  Samuel Gonzalez is doing very well on CPAP therapy.  Compliance report reveals excellent compliance.  He was encouraged to continue using CPAP nightly and for greater than 4 hours each night. I will place order for new CPAP machine as his is greater than 47 years old and he is having difficulty with water chamber seal. Sleep study in Epic. He was encouraged to focus on healthy lifestyle habits.  Adequate hydration, sleep hygiene, well-balanced diet and regular exercise advised.  He will continue to follow-up closely with primary care.  He will keep an eye on his blood pressures at home.  He will follow-up with me 31-90 days following set up of new CPAP.  He verbalizes understanding and agreement with this plan.    Orders Placed This Encounter  Procedures   For home use only DME continuous positive airway pressure (CPAP)    New CPAP machine with set pressure 8cmH20, EPR 3    Order Specific Question:   Length of Need    Answer:   Lifetime    Order Specific Question:   Patient has OSA or probable OSA    Answer:   Yes    Order Specific Question:   Is the patient currently using CPAP in the home    Answer:   Yes    Order Specific Question:   Settings    Answer:   Other see comments    Order Specific Question:   CPAP supplies needed    Answer:   Mask, headgear, cushions, filters, heated tubing and water chamber      No orders of the defined types were placed in this encounter.     Debbora Presto, FNP-C 09/30/2020, 1:24 PM [facility] [address], [place], [place] [address] [phone]  I reviewed the above note and documentation by the Nurse Practitioner and agree with the history, exam, assessment and plan as outlined above. I was available for consultation. Star Age, MD, PhD [facility])

## 2020-09-30 ENCOUNTER — Encounter: Payer: Self-pay | Admitting: Family Medicine

## 2020-09-30 ENCOUNTER — Ambulatory Visit: Payer: 59 | Admitting: Family Medicine

## 2020-09-30 ENCOUNTER — Other Ambulatory Visit: Payer: Self-pay

## 2020-09-30 VITALS — BP 149/87 | HR 54 | Ht 70.0 in | Wt 258.0 lb

## 2020-09-30 DIAGNOSIS — Z9989 Dependence on other enabling machines and devices: Secondary | ICD-10-CM

## 2020-09-30 DIAGNOSIS — G4733 Obstructive sleep apnea (adult) (pediatric): Secondary | ICD-10-CM

## 2020-09-30 NOTE — Progress Notes (Signed)
CM sent to [facility] 

## 2021-03-16 ENCOUNTER — Other Ambulatory Visit (HOSPITAL_BASED_OUTPATIENT_CLINIC_OR_DEPARTMENT_OTHER): Payer: Self-pay | Admitting: Nurse Practitioner

## 2021-03-16 DIAGNOSIS — M25551 Pain in right hip: Secondary | ICD-10-CM

## 2021-03-19 ENCOUNTER — Ambulatory Visit (HOSPITAL_BASED_OUTPATIENT_CLINIC_OR_DEPARTMENT_OTHER)
Admission: RE | Admit: 2021-03-19 | Discharge: 2021-03-19 | Disposition: A | Discharge status: Home / Self Care | Payer: 59 | Source: Ambulatory Visit | Attending: Nurse Practitioner | Admitting: Nurse Practitioner

## 2021-03-19 ENCOUNTER — Other Ambulatory Visit: Payer: Self-pay

## 2021-03-19 DIAGNOSIS — M25551 Pain in right hip: Secondary | ICD-10-CM

## 2022-03-05 ENCOUNTER — Encounter: Payer: Self-pay | Admitting: Cardiology

## 2022-03-05 NOTE — Progress Notes (Signed)
  Left Heart Catheterization 04/20/2015:  1. Normal coronary arteries, mildly tortuous LAD. Slow flow through the coronary arteries due to elevated LVEDP. Suspect his chest pain could be related to microvascular angina versus GERD. 2. Low normal LVEF, 50-55%.

## 2022-03-08 ENCOUNTER — Ambulatory Visit: Payer: 59 | Admitting: Cardiology

## 2022-03-16 IMAGING — MR MR HIP*R* W/O CM
5 series · 40 of 40 positions shown · non-contrast
Comparison: None.

CLINICAL DATA: Right hip pain.

EXAM:
MR OF THE RIGHT HIP WITHOUT CONTRAST
TECHNIQUE: Multiplanar, multisequence MR imaging was performed. No intravenous
contrast was administered.

[Series 3: T1 · coronal · 4.0mm · 1.41mm/px · 8 of 29 slices shown]
[im 1/29]
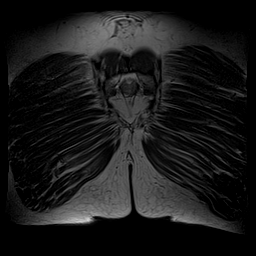
[im 5/29]
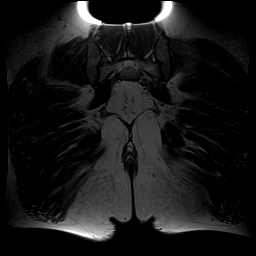
[im 9/29]
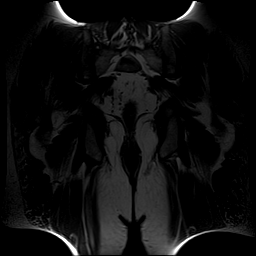
[im 13/29]
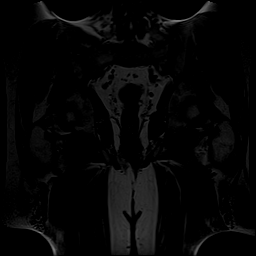
[im 17/29]
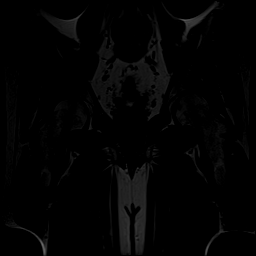
[im 21/29]
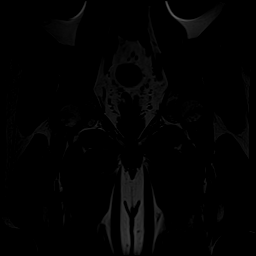
[im 25/29]
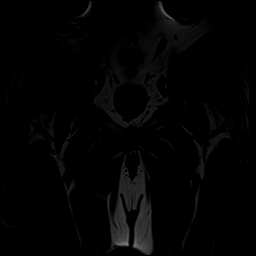
[im 29/29]
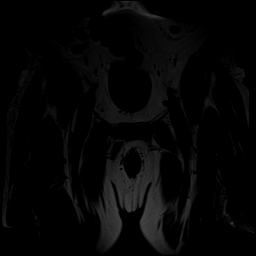

[Series 4: T2 fat-sat · coronal · 4.0mm · 1.41mm/px · 9 of 29 slices shown (1 of 2)]
[im 1/29]
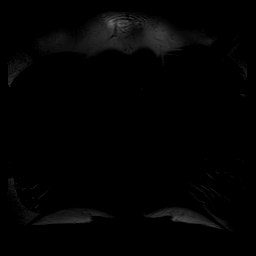
[im 4/29]
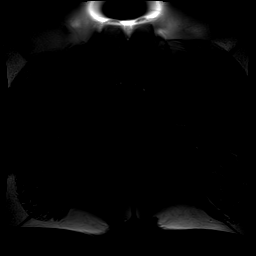
[im 8/29]
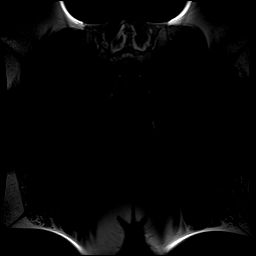
[im 11/29]
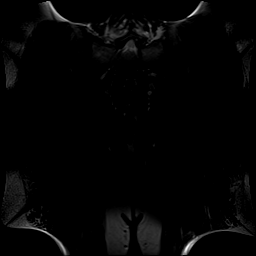
[im 15/29]
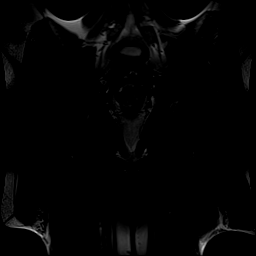
[im 18/29]
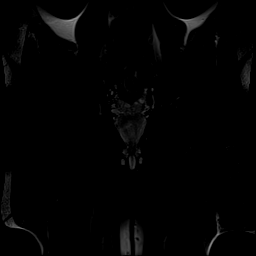
[im 22/29]
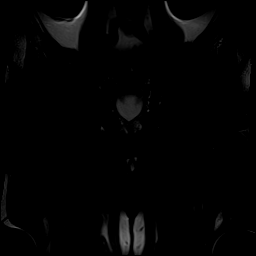
[im 25/29]
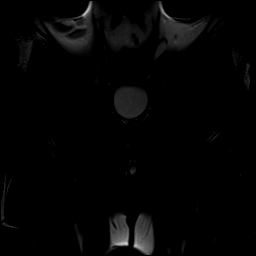
[im 29/29]
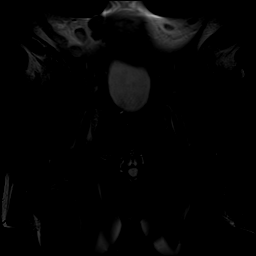

[Series 5: T2 fat-sat · axial · 4.0mm · 1.41mm/px · z∈[-28,+107]mm · 8 of 28 slices shown (2 of 2)]
[im 1/28]
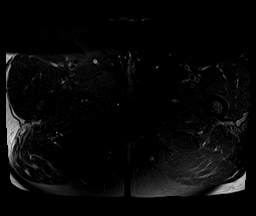
[im 4/28]
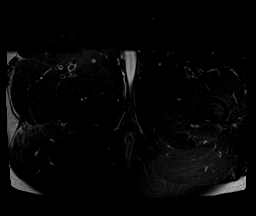
[im 8/28]
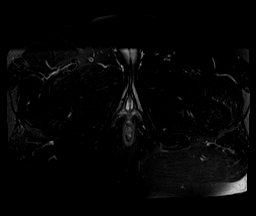
[im 12/28]
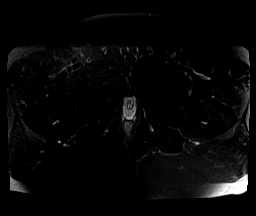
[im 16/28]
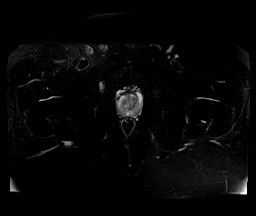
[im 20/28]
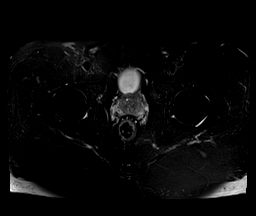
[im 24/28]
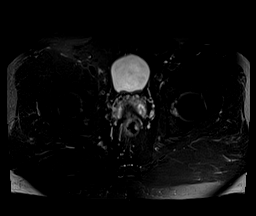
[im 28/28]
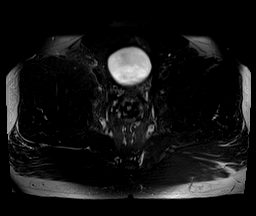

[Series 6: PD fat-sat · sagittal · 4.0mm · 0.70mm/px · 8 of 26 slices shown (1 of 2)]
[im 1/26]
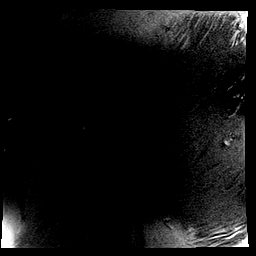
[im 4/26]
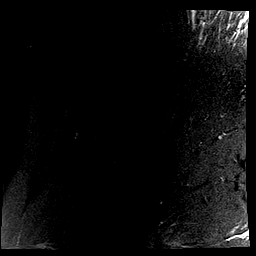
[im 8/26]
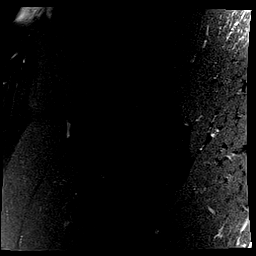
[im 11/26]
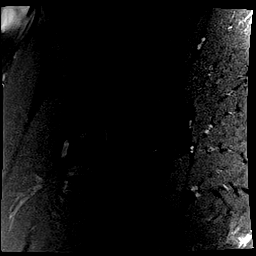
[im 15/26]
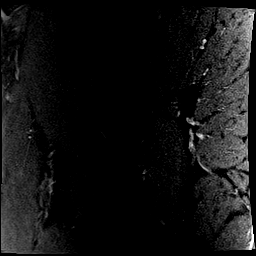
[im 18/26]
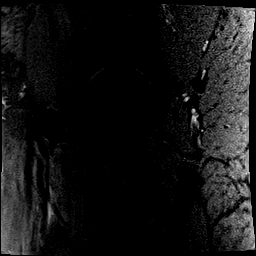
[im 22/26]
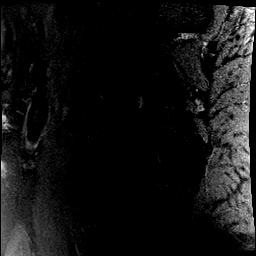
[im 26/26]
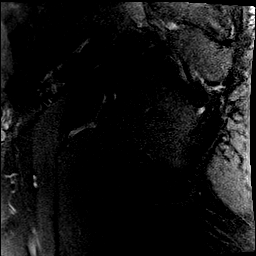

[Series 7: PD fat-sat · coronal · 4.0mm · 0.70mm/px · 7 of 22 slices shown (2 of 2)]
[im 1/22]
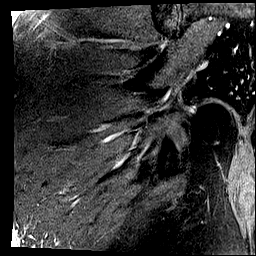
[im 4/22]
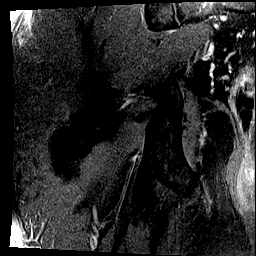
[im 8/22]
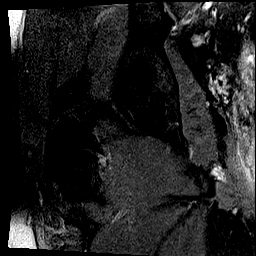
[im 11/22]
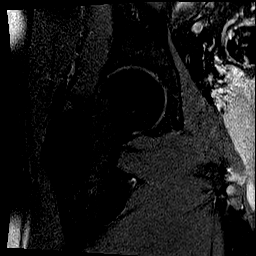
[im 15/22]
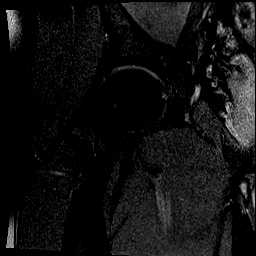
[im 18/22]
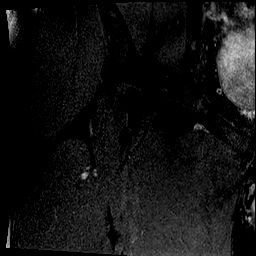
[im 22/22]
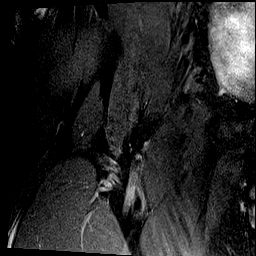

[40 of 40 positions shown; findings below may reference images not displayed]

FINDINGS: Bones:

No hip fracture, dislocation or avascular necrosis.

No periosteal reaction or bone destruction. No aggressive osseous
lesion.

Normal sacrum and sacroiliac joints. No SI joint widening or erosive
changes.

Visualized lower lumbar spine demonstrates no focal abnormality.

Articular cartilage and labrum

Articular cartilage: Partial-thickness cartilage loss of bilateral
hips.

Labrum: Grossly intact, but evaluation is limited by lack of
intraarticular fluid.

Joint or bursal effusion

Joint effusion:  No hip joint effusion.  No SI joint effusion.

Bursae:  No bursa formation.

Muscles and tendons

Flexors: Normal.

Extensors: Normal.

Abductors: Normal.

Adductors: Normal.

Gluteals: Normal.

Hamstrings: Mild tendinosis of the left hamstring origin.

Other findings

No pelvic free fluid. No fluid collection or hematoma. No inguinal
lymphadenopathy. No inguinal hernia.
IMPRESSION: 1. No hip fracture, dislocation or avascular necrosis.
2. Mild osteoarthritis of bilateral hips.
3. Mild tendinosis of the left hamstring origin.

## 2022-03-23 ENCOUNTER — Ambulatory Visit: Payer: 59 | Admitting: Cardiology

## 2022-04-10 ENCOUNTER — Ambulatory Visit: Payer: 59 | Admitting: Cardiology

## 2022-04-10 ENCOUNTER — Encounter: Payer: Self-pay | Admitting: Cardiology

## 2022-04-10 VITALS — BP 141/84 | HR 68 | Resp 16 | Ht 70.0 in | Wt 273.0 lb

## 2022-04-10 DIAGNOSIS — I10 Essential (primary) hypertension: Secondary | ICD-10-CM

## 2022-04-10 DIAGNOSIS — Z01818 Encounter for other preprocedural examination: Secondary | ICD-10-CM

## 2022-04-10 NOTE — Progress Notes (Signed)
Patient referred by [facility] for pre op clearance  Subjective:   Samuel Gonzalez, male    DOB: 01-17-1966, 57 y.o.   MRN: [id number]   Chief Complaint  Patient presents with   Pre-op Exam    Right total hip replacement - Referred by Dr. Gaynelle Arabian      HPI  57 y.o. Caucasian male with hypertension, obesity, here for preop evaluation prior to hip replacement surgery  Patient was last seen by Dr. Einar Gip in 2017.  At that time, he had undergone heart catheterization for complaints of chest pain which showed normal coronaries.  Patient has had similar chest pain episodes with stress that last for several hours.  However, he has no chest pain with walking up to 4 to 6 miles at work, working in a tobacco company.  Blood pressure is generally well-controlled.   Past Medical History:  Diagnosis Date   Chest pain      Past Surgical History:  Procedure Laterality Date   CARDIAC CATHETERIZATION N/A 04/20/2015   Procedure: Left Heart Cath and Coronary Angiography;  Surgeon: Adrian Prows, MD;  Location: [facility];  Service: Cardiovascular;  Laterality: N/A;     Social History   Tobacco Use  Smoking Status Never  Smokeless Tobacco Never    Social History   Substance and Sexual Activity  Alcohol Use Yes   Alcohol/week: 0.0 standard drinks of alcohol     Family History  Problem Relation Age of Onset   Diabetes Mother    Hypertension Mother    Heart disease Maternal Grandmother    Heart disease Maternal Grandfather    Cancer Paternal Grandmother    Cancer Paternal Grandfather       Current Outpatient Medications:    metoprolol succinate (TOPROL-XL) 50 MG 24 hr tablet, Take 50 mg by mouth daily. Take with or immediately following a meal., Disp: , Rfl:    pantoprazole (PROTONIX) 40 MG tablet, Take 1 tablet (40 mg total) by mouth daily., Disp: 30 tablet, Rfl: 0   rosuvastatin (CRESTOR) 20 MG tablet, Take 20 mg by mouth daily., Disp: , Rfl:  5   Cardiovascular and other pertinent studies:  Reviewed external labs and tests, independently interpreted  EKG 04/10/2022: Sinus rhythm 67 bpm with sinus arrhtymia Otherwise normal EKG   Coronary angiogram 2017: 1. Normal coronary arteries, mildly tortuous LAD. Slow flow through the coronary arteries due to elevated LVEDP. Suspect his chest pain could be related to microvascular angina versus GERD. 2. Low normal LVEF, 50-55%.    Recent labs: Oct-Dec 2023: Glucose 140, BUN/Cr 13/1.1. EGFR 76. K 4.4 HbA1C 6.5% Chol 151, TG 145, HDL 44, LDL 82    Review of Systems  Cardiovascular:  Positive for chest pain (non cardiac). Negative for dyspnea on exertion, leg swelling, palpitations and syncope.        Vitals:   04/10/22 0946  BP: (!) 141/84  Pulse: 68  Resp: 16  SpO2: 97%     Body mass index is 39.17 kg/m. Filed Weights   04/10/22 0946  Weight: 273 lb (123.8 kg)     Objective:   Physical Exam Vitals and nursing note reviewed.  Constitutional:      General: He is not in acute distress.    Appearance: He is obese.  Neck:     Vascular: No JVD.  Cardiovascular:     Rate and Rhythm: Normal rate and regular rhythm.     Heart sounds: Normal  heart sounds. No murmur heard. Pulmonary:     Effort: Pulmonary effort is normal.     Breath sounds: Normal breath sounds. No wheezing or rales.  Musculoskeletal:     Right lower leg: No edema.     Left lower leg: No edema.         Visit diagnoses:   ICD-10-CM   1. Pre-op evaluation  Z01.818 EKG 12-Lead    PCV ECHOCARDIOGRAM COMPLETE    2. Essential hypertension  I10 PCV ECHOCARDIOGRAM COMPLETE       Orders Placed This Encounter  Procedures   EKG 12-Lead     Assessment & Recommendations:   57 y.o. Caucasian male with hypertension, obesity, here for preop evaluation prior to hip replacement surgery  Preop evaluation: Good baseline functional capacity. Fairly well-controlled blood pressure. Longstanding  precordial chest pain with normal coronaries on angiogram in 2017.  Given good functional capacity and not exertional chest pain, I do not think he needs further workup before the surgery.  He may proceed with hip replacement surgery with low cardiac risk. On a separate note, I will obtain an echocardiogram given his underlying hypertension, but is unlikely to change my above recommendation regarding cardiac restratification.  I will see him on as-needed basis   Thank you for referring the patient to [place]. Please feel free to contact with any questions.   Nigel Mormon, MD Pager: [phone] Office: [phone]

## 2022-05-11 ENCOUNTER — Ambulatory Visit: Payer: 59

## 2022-05-11 DIAGNOSIS — I10 Essential (primary) hypertension: Secondary | ICD-10-CM

## 2022-05-11 DIAGNOSIS — Z01818 Encounter for other preprocedural examination: Secondary | ICD-10-CM

## 2022-05-17 ENCOUNTER — Other Ambulatory Visit: Payer: Self-pay | Admitting: Urology

## 2022-05-17 DIAGNOSIS — R972 Elevated prostate specific antigen [PSA]: Secondary | ICD-10-CM

## 2022-05-27 HISTORY — PX: OTHER SURGICAL HISTORY: SHX169

## 2022-05-29 ENCOUNTER — Ambulatory Visit: Payer: 59 | Admitting: Cardiology

## 2022-05-29 ENCOUNTER — Encounter: Payer: Self-pay | Admitting: Cardiology

## 2022-05-29 VITALS — BP 138/66 | HR 60 | Resp 18 | Ht 70.0 in | Wt 275.2 lb

## 2022-05-29 DIAGNOSIS — Z01818 Encounter for other preprocedural examination: Secondary | ICD-10-CM

## 2022-05-29 DIAGNOSIS — I10 Essential (primary) hypertension: Secondary | ICD-10-CM | POA: Insufficient documentation

## 2022-05-29 NOTE — Progress Notes (Signed)
Patient referred by [facility] for pre op clearance  Subjective:   Samuel Gonzalez, male    DOB: Jul 05, 1965, 57 y.o.   MRN: [id number]   Chief Complaint  Patient presents with   Hypertension   Follow-up    6 weeks     HPI  57 y.o. Caucasian male with hypertension, obesity, here for preop evaluation prior to hip replacement surgery  Patient denies chest pain, shortness of breath, palpitations, leg edema, orthopnea, PND, TIA/syncope. Reviewed recent test results with the patient, details below.      Current Outpatient Medications:    cyclobenzaprine (FLEXERIL) 10 MG tablet, Take 10 mg by mouth at bedtime as needed., Disp: , Rfl:    metoprolol succinate (TOPROL-XL) 50 MG 24 hr tablet, Take 50 mg by mouth daily. Take with or immediately following a meal., Disp: , Rfl:    pantoprazole (PROTONIX) 40 MG tablet, Take 1 tablet (40 mg total) by mouth daily., Disp: 30 tablet, Rfl: 0   rosuvastatin (CRESTOR) 20 MG tablet, Take 20 mg by mouth daily., Disp: , Rfl: 5   Cardiovascular and other pertinent studies:  Reviewed external labs and tests, independently interpreted  EKG 04/10/2022: Sinus rhythm 67 bpm with sinus arrhtymia Otherwise normal EKG  Echocardiogram 05/11/2022:  Normal LV systolic function with visual EF 55-60%. Left ventricle cavity  is normal in size. Moderate concentric hypertrophy of the left ventricle.  Normal global wall motion. Normal diastolic filling pattern, normal LAP.  Calculated EF 57%.  Left atrial cavity is slightly dilated.  Structurally normal tricuspid valve with trace regurgitation. No evidence  of pulmonary hypertension.  No significant change compared to 03/2015.   Coronary angiogram 2017: 1. Normal coronary arteries, mildly tortuous LAD. Slow flow through the coronary arteries due to elevated LVEDP. Suspect his chest pain could be related to microvascular angina versus GERD. 2. Low normal LVEF, 50-55%.    Recent labs: Oct-Dec  2023: Glucose 140, BUN/Cr 13/1.1. EGFR 76. K 4.4 HbA1C 6.5% Chol 151, TG 145, HDL 44, LDL 82    Review of Systems  Cardiovascular:  Positive for chest pain (non cardiac). Negative for dyspnea on exertion, leg swelling, palpitations and syncope.        Vitals:   05/29/22 1255  BP: 138/66  Pulse: 60  Resp: 18  SpO2: 97%     Body mass index is 39.49 kg/m. Filed Weights   05/29/22 1255  Weight: 275 lb 3.2 oz (124.8 kg)     Objective:   Physical Exam Vitals and nursing note reviewed.  Constitutional:      General: He is not in acute distress.    Appearance: He is obese.  Neck:     Vascular: No JVD.  Cardiovascular:     Rate and Rhythm: Normal rate and regular rhythm.     Heart sounds: Normal heart sounds. No murmur heard. Pulmonary:     Effort: Pulmonary effort is normal.     Breath sounds: Normal breath sounds. No wheezing or rales.  Musculoskeletal:     Right lower leg: No edema.     Left lower leg: No edema.         Visit diagnoses:   ICD-10-CM   1. Essential hypertension  I10     2. Pre-op evaluation  Z01.818         Assessment & Recommendations:   57 y.o. Caucasian male with hypertension, obesity, here for preop evaluation prior to hip replacement surgery  Preop evaluation:  Good baseline functional capacity. Fairly well-controlled blood pressure. Longstanding precordial chest pain with normal coronaries on angiogram in 2017.  Structurally normal heart. Low cardiac risk for hip replacement surgery.  I will see him on as-needed basis    Elder Negus, MD Pager: [phone] Office: [phone]

## 2022-05-30 ENCOUNTER — Ambulatory Visit (HOSPITAL_COMMUNITY)
Admission: RE | Admit: 2022-05-30 | Discharge: 2022-05-30 | Disposition: A | Discharge status: Home / Self Care | Payer: 59 | Source: Ambulatory Visit | Attending: Urology | Admitting: Urology

## 2022-05-30 DIAGNOSIS — R972 Elevated prostate specific antigen [PSA]: Secondary | ICD-10-CM | POA: Insufficient documentation

## 2022-05-30 MED ORDER — GADOBUTROL 1 MMOL/ML IV SOLN
10.0000 mL | Freq: Once | INTRAVENOUS | Status: AC | PRN
  Administered 2022-05-30: 10 mL via INTRAVENOUS

## 2022-06-12 ENCOUNTER — Other Ambulatory Visit: Payer: 59

## 2022-08-16 ENCOUNTER — Other Ambulatory Visit (HOSPITAL_COMMUNITY): Payer: Self-pay | Admitting: Urology

## 2022-08-16 DIAGNOSIS — C61 Malignant neoplasm of prostate: Secondary | ICD-10-CM

## 2022-08-24 ENCOUNTER — Telehealth: Payer: Self-pay

## 2022-08-24 NOTE — Telephone Encounter (Signed)
I called pt to introduce myself as  the Coordinator of the [facility].   1. I confirmed with the patient he is aware of his referral to the clinic 8/9, arriving @ 8:15 am.    2. I discussed the format of the clinic and the physicians he will be seeing that day.   3. I discussed where the clinic is located and how to contact me.   4. I confirmed his address and informed him I would be mailing a packet of information and forms to be completed. I asked him to bring them with him the day of his appointment.    He voiced understanding of the above. I asked him to call me if he has any questions or concerns regarding his appointments or the forms he needs to complete.

## 2022-09-04 ENCOUNTER — Encounter (HOSPITAL_COMMUNITY)
Admission: RE | Admit: 2022-09-04 | Discharge: 2022-09-04 | Disposition: A | Discharge status: Home / Self Care | Payer: 59 | Source: Ambulatory Visit | Attending: Urology | Admitting: Urology

## 2022-09-04 DIAGNOSIS — C61 Malignant neoplasm of prostate: Secondary | ICD-10-CM | POA: Diagnosis present

## 2022-09-04 MED ORDER — PIFLIFOLASTAT F 18 (PYLARIFY) INJECTION
9.0000 | Freq: Once | INTRAVENOUS | Status: AC
  Administered 2022-09-04: 9.6 via INTRAVENOUS

## 2022-09-14 NOTE — Progress Notes (Signed)
RN left message with patient as reminder for upcoming [facility] appointment.  Direct number left for call back.

## 2022-09-14 NOTE — Progress Notes (Signed)
                               Care Plan Summary  Name: Samuel Gonzalez DOB: 03-Sep-1965   Your Medical Team:   Urologist -  Dr. Heloise Purpura, [facility]  Radiation Oncologist - Dr. Margaretmary Dys, [facility]     Recommendations: 1) Radiation   2) Surgery     * These recommendations are based on information available as of today's consult.      Recommendations may change depending on the results of further tests or exams.    Next Steps: 1) Consider all your options.  Contact Marisue Ivan, your nurse navigator, with any questions or treatment decisions.     When appointments need to be scheduled, you will be contacted by [facility] and/or [facility].  Questions?  Please do not hesitate to call Cherlyn Cushing, BSN, RN at [phone] with any questions or concerns.  Marisue Ivan is your Oncology Nurse Navigator and is available to assist you while you're receiving your medical care at [facility].

## 2022-09-15 ENCOUNTER — Inpatient Hospital Stay: Payer: 59 | Attending: Radiation Oncology | Admitting: Genetic Counselor

## 2022-09-15 ENCOUNTER — Encounter: Payer: Self-pay | Admitting: Radiation Oncology

## 2022-09-15 ENCOUNTER — Other Ambulatory Visit: Payer: Self-pay

## 2022-09-15 ENCOUNTER — Inpatient Hospital Stay: Payer: 59

## 2022-09-15 ENCOUNTER — Other Ambulatory Visit: Payer: Self-pay | Admitting: Genetic Counselor

## 2022-09-15 ENCOUNTER — Ambulatory Visit
Admission: RE | Admit: 2022-09-15 | Discharge: 2022-09-15 | Disposition: A | Discharge status: Home / Self Care | Payer: 59 | Source: Ambulatory Visit | Attending: Radiation Oncology | Admitting: Radiation Oncology

## 2022-09-15 ENCOUNTER — Encounter: Payer: Self-pay | Admitting: Genetic Counselor

## 2022-09-15 VITALS — BP 141/98 | HR 62 | Temp 97.1°F | Resp 18 | Ht 70.0 in | Wt 269.2 lb

## 2022-09-15 DIAGNOSIS — C61 Malignant neoplasm of prostate: Secondary | ICD-10-CM

## 2022-09-15 DIAGNOSIS — Z800 Family history of malignant neoplasm of digestive organs: Secondary | ICD-10-CM | POA: Diagnosis not present

## 2022-09-15 DIAGNOSIS — Z8042 Family history of malignant neoplasm of prostate: Secondary | ICD-10-CM

## 2022-09-15 DIAGNOSIS — Z1379 Encounter for other screening for genetic and chromosomal anomalies: Secondary | ICD-10-CM

## 2022-09-15 DIAGNOSIS — Z803 Family history of malignant neoplasm of breast: Secondary | ICD-10-CM

## 2022-09-15 HISTORY — DX: Essential (primary) hypertension: I10

## 2022-09-15 HISTORY — DX: Gastro-esophageal reflux disease without esophagitis: K21.9

## 2022-09-15 HISTORY — DX: Hyperlipidemia, unspecified: E78.5

## 2022-09-15 LAB — GENETIC SCREENING ORDER

## 2022-09-15 NOTE — Progress Notes (Signed)
REFERRING PROVIDER: Margaretmary Dys, MD  PRIMARY PROVIDER:  [facility]  PRIMARY REASON FOR VISIT:  1. Malignant neoplasm of prostate (HCC)   2. Family history of prostate cancer   3. Family history of colon cancer   4. Family history of breast cancer    HISTORY OF PRESENT ILLNESS:   Mr. Rudloff, a 57 y.o. male, was seen for a [place] cancer genetics consultation at the request of Dr. Kathrynn Running due to a personal and family history of cancer.  Mr. Steward presents to clinic today to discuss the possibility of a hereditary predisposition to cancer, to discuss genetic testing, and to further clarify his future cancer risks, as well as potential cancer risks for family members.   In July 2024, at the age of 59, Mr. Mundinger was diagnosed with prostate cancer.   Past Medical History:  Diagnosis Date   Chest pain    GERD (gastroesophageal reflux disease)    Hyperlipidemia    Hypertension     Past Surgical History:  Procedure Laterality Date   CARDIAC CATHETERIZATION N/A 04/20/2015   Procedure: Left Heart Cath and Coronary Angiography;  Surgeon: Yates Decamp, MD;  Location: [facility];  Service: Cardiovascular;  Laterality: N/A;   COLONOSCOPY     PROSTATE BIOPSY      Social History   Socioeconomic History   Marital status: Married    Spouse name: Not on file   Number of children: 2   Years of education: College   Highest education level: Not on file  Occupational History   Not on file  Tobacco Use   Smoking status: Never   Smokeless tobacco: Never  Vaping Use   Vaping status: Never Used  Substance and Sexual Activity   Alcohol use: Yes    Alcohol/week: 2.0 - 3.0 standard drinks of alcohol    Types: 2 - 3 Cans of beer per week    Comment: 2-3 beers/week   Drug use: No   Sexual activity: Not on file  Other Topics Concern   Not on file  Social History Narrative   Drinks 2-3 cokes a day    Social Determinants of Health   Financial Resource  Strain: Not on file  Food Insecurity: No Food Insecurity (09/15/2022)   Hunger Vital Sign    Worried About Running Out of Food in the Last Year: Never true    Ran Out of Food in the Last Year: Never true  Transportation Needs: No Transportation Needs (09/15/2022)   PRAPARE - Administrator, Civil Service (Medical): No    Lack of Transportation (Non-Medical): No  Physical Activity: Not on file  Stress: Not on file  Social Connections: Not on file     FAMILY HISTORY:  We obtained a detailed, 4-generation family history.  Significant diagnoses are listed below: Family History  Problem Relation Age of Onset   Diabetes Mother    Hypertension Mother    Prostate cancer Maternal Uncle 43 - 69   Colon cancer Paternal Uncle 88   Prostate cancer Paternal Uncle 60   Heart disease Maternal Grandmother    Heart disease Maternal Grandfather    Breast cancer Paternal Grandmother 23   Bone cancer Paternal Grandmother 22   Prostate cancer Paternal Grandfather 89   Throat cancer Paternal Grandfather 41      Mr. Antonetti paternal uncle was diagnosed with colon cancer at age 15 and prostate cancer at age 15. His prostate cancer recurred  at age 22 and he is currently being treated with chemotherapy. His paternal grandmother was diagnosed with breast cancer at age 93 and bone cancer (separate primary) at age 68, she is deceased. His paternal grandfather was diagnosed with prostate cancer at age 41 and throat cancer at age 72, he is deceased. Mr. Amerman maternal uncle was diagnosed with prostate cancer in his late 41s. Mr. Edgerson is unaware of previous family history of genetic testing for hereditary cancer risks. There is no reported Ashkenazi Jewish ancestry.   GENETIC COUNSELING ASSESSMENT: Mr. Shope is a 57 y.o. male with a personal and family history of cancer which is somewhat suggestive of a hereditary predisposition to cancer. We, therefore, discussed and recommended the following at today's  visit.   DISCUSSION: We discussed that 5 - 10% of cancer is hereditary, with most cases of prostate cancer associated with BRCA1/2.  There are other genes that can be associated with hereditary prostate cancer syndromes.  We discussed that testing is beneficial for several reasons including knowing how to follow individuals after completing their treatment, identifying whether potential treatment options would be beneficial, and understanding if other family members could be at risk for cancer and allowing them to undergo genetic testing.   We reviewed the characteristics, features and inheritance patterns of hereditary cancer syndromes. We also discussed genetic testing, including the appropriate family members to test, the process of testing, insurance coverage and turn-around-time for results. We discussed the implications of a negative, positive, carrier and/or variant of uncertain significant result. We recommended Mr. Alavi pursue genetic testing for a panel that includes genes associated with prostate, colon, and breast cancer.   Mr. Covin elected to have [facility] Custom Panel. The Custom Hereditary Cancers Panel offered by [facility] includes sequencing and/or deletion duplication testing of the following 43 genes: APC, ATM, AXIN2, BAP1, BARD1, BMPR1A, BRCA1, BRCA2, BRIP1, CDH1, CDK4, CDKN2A (p14ARF and p16INK4a only), CHEK2, CTNNA1, EPCAM (Deletion/duplication testing only), FH, GREM1 (promoter region duplication testing only), HOXB13, KIT, MBD4, MEN1, MLH1, MSH2, MSH3, MSH6, MUTYH, NF1, NHTL1, PALB2, PDGFRA, PMS2, POLD1, POLE, PTEN, RAD51C, RAD51D, SMAD4, SMARCA4. STK11, TP53, TSC1, TSC2, and VHL.  Based on Mr. Carmona personal and family history of cancer, he meets medical criteria for genetic testing. Despite that he meets criteria, he may still have an out of pocket cost. We discussed that if his out of pocket cost for testing is over $100, the laboratory will call and confirm whether he wants to  proceed with testing.  If the out of pocket cost of testing is less than $100 he will be billed by the genetic testing laboratory.   PLAN: After considering the risks, benefits, and limitations, Mr. Yoshimoto provided informed consent to pursue genetic testing and the blood sample was sent to [facility] for analysis of the Custom Panel. Results should be available within approximately 2-3 weeks' time, at which point they will be disclosed by telephone to Mr. Shellhouse, as will any additional recommendations warranted by these results. Mr. Porcella will receive a summary of his genetic counseling visit and a copy of his results once available. This information will also be available in Epic.   Mr. Kan questions were answered to his satisfaction today. Our contact information was provided should additional questions or concerns arise. Thank you for the referral and allowing [place] to share in the care of your patient.   Lalla Brothers, MS, [facility] Genetic Counselor [email or web address] (P) [phone]  The patient was seen for a total of 20  minutes in face-to-face genetic counseling.  The patient brought his wife. Drs. Pamelia Hoit and/or Mosetta Putt were available to discuss this case as needed.   _______________________________________________________________________ For Office Staff:  Number of people involved in session: 2 Was an Intern/ student involved with case: no

## 2022-09-15 NOTE — Progress Notes (Addendum)
Radiation Oncology         [phone] ________________________________  Multidisciplinary Prostate Cancer Clinic  Initial Radiation Oncology Consultation  Name: Samuel Gonzalez MRN: [id number]  Date: 09/15/2022  DOB: Sep 18, 1965  YN:WGNFA, [facility], Earle Gell, MD   REFERRING PHYSICIAN: Crist Fat, MD  DIAGNOSIS: 57 y.o. gentleman with stage T1c adenocarcinoma of the prostate with a Gleason's score of 4+3 and a PSA of 4.9    ICD-10-CM   1. Malignant neoplasm of prostate (HCC)  C61       HISTORY OF PRESENT ILLNESS::Samuel Gonzalez is a 57 y.o. gentleman. He is an established patient with Dr. Marlou Porch at [facility] for a history of a fluctuating PSA. He was initially seen for a PSA that subsequently returned to 2.58 on re-check. He was referred back in late 2022 with a PSA of 5.82, and he has been on surveillance after his subsequent PSA dropped to 4.02. More recently, his PSA rose to 4.91 in 05/2022. This prompted a prostate MRI on 05/30/22 showing: PI-RADS 3 lesion of right posterolateral and posteromedial peripheral zone in mid gland; slight bulging of right posterior prostate near apex, potentially in associated with ROI #1. The patient proceeded to MRI fusion biopsy of the prostate on 08/01/22.  The prostate volume measured 40 cc.  Out of 16 core biopsies, 2 were positive.  The maximum Gleason score was 4+4, and this was seen in right base lateral (small focus). Additionally, Gleason 3+3 was seen in left mid lateral.    He underwent staging PSMA PET scan on 09/04/22 showing no evidence of activity outside of the right lobe of the prostate gland.    The patient reviewed the biopsy results with his urologist and he has kindly been referred today to the multidisciplinary prostate cancer clinic for presentation of pathology and radiology studies in our conference for discussion of potential radiation treatment options and clinical evaluation.  During  our conference, the pathology was re-reviewed and downgraded from Gleason 4+4 to Gleason 4+3 in 5% of one specimen.  PREVIOUS RADIATION THERAPY: No  PAST MEDICAL HISTORY:  has a past medical history of Chest pain.    PAST SURGICAL HISTORY: Past Surgical History:  Procedure Laterality Date   CARDIAC CATHETERIZATION N/A 04/20/2015   Procedure: Left Heart Cath and Coronary Angiography;  Surgeon: Yates Decamp, MD;  Location: [facility] INVASIVE CV LAB;  Service: Cardiovascular;  Laterality: N/A;    FAMILY HISTORY: family history includes Cancer in his paternal grandfather and paternal grandmother; Diabetes in his mother; Heart disease in his maternal grandfather and maternal grandmother; Hypertension in his mother.  SOCIAL HISTORY:  reports that he has never smoked. He has never used smokeless tobacco. He reports current alcohol use. He reports that he does not use drugs.  ALLERGIES: Aspirin and Peanut-containing drug products  MEDICATIONS:  Current Outpatient Medications  Medication Sig Dispense Refill   cyclobenzaprine (FLEXERIL) 10 MG tablet Take 10 mg by mouth at bedtime as needed.     metoprolol succinate (TOPROL-XL) 50 MG 24 hr tablet Take 50 mg by mouth daily. Take with or immediately following a meal.     pantoprazole (PROTONIX) 40 MG tablet Take 1 tablet (40 mg total) by mouth daily. 30 tablet 0   rosuvastatin (CRESTOR) 20 MG tablet Take 20 mg by mouth daily.  5   No current facility-administered medications for this encounter.      REVIEW OF SYSTEMS:  On review of systems, the  patient reports that he is doing well overall. He denies any chest pain, shortness of breath, cough, fevers, chills, night sweats, unintended weight changes. He denies any bowel disturbances, and denies abdominal pain, nausea or vomiting. He denies any new musculoskeletal or joint aches or pains. His IPSS was Total Score: 2, indicating mild urinary symptoms (Reference 0-7 mild, 8-19 moderate, 20-35 severe).  His SHIM:  24, indicating he does not have erectile dysfunction (Reference - 22-25 None, 17-21 Mild, 8-16 Moderate, 1-7 Severe). A complete review of systems is obtained and is otherwise negative.   PHYSICAL EXAM:  Wt Readings from Last 3 Encounters:  05/29/22 275 lb 3.2 oz (124.8 kg)  04/10/22 273 lb (123.8 kg)  09/30/20 258 lb (117 kg)   Temp Readings from Last 3 Encounters:  03/27/17 98.1 F (36.7 C) (Oral)  04/20/15 98.5 F (36.9 C) (Oral)  03/06/15 97.8 F (36.6 C) (Oral)   BP Readings from Last 3 Encounters:  05/29/22 138/66  04/10/22 (!) 141/84  09/30/20 (!) 149/87   Pulse Readings from Last 3 Encounters:  05/29/22 60  04/10/22 68  09/30/20 (!) 54    /10  In general this is a well appearing man in no acute distress. He's alert and oriented x4 and appropriate throughout the examination. Cardiopulmonary assessment is negative for acute distress and he exhibits normal effort.    KPS = 100  100 - Normal; no complaints; no evidence of disease. 90   - Able to carry on normal activity; minor signs or symptoms of disease. 80   - Normal activity with effort; some signs or symptoms of disease. 82   - Cares for self; unable to carry on normal activity or to do active work. 60   - Requires occasional assistance, but is able to care for most of his personal needs. 50   - Requires considerable assistance and frequent medical care. 40   - Disabled; requires special care and assistance. 30   - Severely disabled; hospital admission is indicated although death not imminent. 20   - Very sick; hospital admission necessary; active supportive treatment necessary. 10   - Moribund; fatal processes progressing rapidly. 0     - Dead  Karnofsky DA, Abelmann WH, Craver LS and Burchenal [facility] [phone]) The use of the nitrogen mustards in the palliative treatment of carcinoma: with particular reference to bronchogenic carcinoma Cancer 1 [phone]   LABORATORY DATA:  Lab Results  Component Value Date   WBC  15.6 (H) 03/27/2017   HGB 17.8 (H) 03/27/2017   HCT 50.7 03/27/2017   MCV 87.7 03/27/2017   PLT 255 03/27/2017   Lab Results  Component Value Date   NA 139 03/27/2017   K 3.6 03/27/2017   CL 101 03/27/2017   CO2 25 03/27/2017   No results found for: "ALT", "AST", "GGT", "ALKPHOS", "BILITOT"   RADIOGRAPHY: NM PET (PSMA) SKULL TO MID THIGH  Result Date: 09/11/2022 CLINICAL DATA:  Prostate carcinoma with biochemical recurrence. EXAM: NUCLEAR MEDICINE PET SKULL BASE TO THIGH TECHNIQUE: mCi F18 Piflufolastat (Pylarify) was injected intravenously. Full-ring PET imaging was performed from the skull base to thigh after the radiotracer. CT data was obtained and used for attenuation correction and anatomic localization. COMPARISON:  None Available. FINDINGS: NECK No radiotracer activity in neck lymph nodes. Incidental CT finding: None. CHEST No radiotracer accumulation within mediastinal or hilar lymph nodes. No suspicious pulmonary nodules on the CT scan. Incidental CT finding: None. ABDOMEN/PELVIS Prostate: Focus of activity posterior RIGHT lobe of  the prostate gland with SUV max equal 8.1 (image 221) Lymph nodes: No abnormal radiotracer accumulation within pelvic or abdominal nodes. Liver: None no abnormal radiotracer activity in liver. Incidental CT finding: None. SKELETON No focal activity to suggest skeletal metastasis. RIGHT hip prosthetic IMPRESSION: 1. Focal activity in the posterior RIGHT lobe of the prostate gland is most consistent with primary prostate adenocarcinoma. 2. No evidence of metastatic adenopathy in the pelvis or periaortic retroperitoneum. 3. No evidence of visceral metastasis or skeletal metastasis. Electronically Signed   By: Genevive Bi M.D.   On: 09/11/2022 15:07      IMPRESSION/PLAN: 57 y.o. gentleman with Stage T1c adenocarcinoma of the prostate with a Gleason score of 4+3 and a PSA of 4.91.  Initially he was felt to have Gleason 4+4 cancer, but, this was downgraded on  further review.  We discussed the patient's workup and outlined the nature of prostate cancer in this setting. The patient's T stage, Gleason's score, and PSA put him into the unfavorable risk group. Accordingly, he is eligible for a variety of potential treatment options including ST-ADT with external radiation for 5 1/2 weeks, or, definitive brachytherapy, or prostatectomy. We discussed the available radiation techniques, and focused on the details and logistics of delivery. We discussed and outlined the risks, benefits, short and long-term effects associated with radiotherapy and compared and contrasted these with prostatectomy. We discussed the role of SpaceOAR gel in reducing the rectal toxicity associated with radiotherapy. We also detailed the role of ADT in the treatment of UIR prostate cancer and outlined the associated side effects that could be expected with this therapy. He appears to have a good understanding of his disease and our treatment recommendations which are of curative intent.  He was encouraged to ask questions that were answered to his/their stated satisfaction.  At the end of the conversation the patient is interested in moving forward with considering his options, but, maybe leaning toward seed implant based on quality of life.   We personally spent 60 minutes in this encounter including chart review, reviewing radiological studies, meeting face-to-face with the patient, entering orders and completing documentation.      Margaretmary Dys, MD  [facility]  Radiation Oncology Direct Dial: [phone]  Fax: [phone] [email or web address]  Skype  [email or web address]   This document serves as a record of services personally performed by Margaretmary Dys, MD. It was created on their behalf by Mickie Bail, a trained medical scribe. The creation of this record is based on the scribe's personal observations and the provider's statements to them. This document has been checked and  approved by the attending provider.

## 2022-09-15 NOTE — Addendum Note (Signed)
Encounter addended by: Margaretmary Dys, MD on: 09/15/2022 10:35 AM  Actions taken: Clinical Note Signed

## 2022-09-22 ENCOUNTER — Telehealth: Payer: Self-pay | Admitting: *Deleted

## 2022-09-22 NOTE — Progress Notes (Signed)
Patient had confirmed that he would like to proceed with brachytherapy.  RN left voicemail for call back to address any questions or concerns since recent [facility] on 8/9.    Awaiting procedure date at this time, will continue to follow.

## 2022-09-22 NOTE — Telephone Encounter (Signed)
CALLED PATIENT TO ASK QUESTIONS, SPOKE WITH PATIENT 

## 2022-09-25 ENCOUNTER — Telehealth: Payer: Self-pay | Admitting: Genetic Counselor

## 2022-09-25 ENCOUNTER — Encounter: Payer: Self-pay | Admitting: Genetic Counselor

## 2022-09-25 DIAGNOSIS — Z1379 Encounter for other screening for genetic and chromosomal anomalies: Secondary | ICD-10-CM | POA: Insufficient documentation

## 2022-09-25 NOTE — Telephone Encounter (Addendum)
I contacted Samuel Gonzalez to discuss his genetic testing results. No pathogenic variants were identified in the 43 genes analyzed. Detailed clinic note to follow.  The test report has been scanned into EPIC and is located under the Molecular Pathology section of the Results Review tab.  A portion of the result report is included below for reference.   Lalla Brothers, MS, [facility] Genetic Counselor [email or web address] (P) [phone]

## 2022-09-27 ENCOUNTER — Telehealth: Payer: Self-pay | Admitting: *Deleted

## 2022-09-27 ENCOUNTER — Other Ambulatory Visit: Payer: Self-pay | Admitting: Urology

## 2022-09-27 NOTE — Telephone Encounter (Signed)
CALLED PATIENT TO  INFORM OF PRE-SEED APPTS. FOR 11-23-22 AND HIS IMPLANT FOR 12-14-22, SPOKE WITH PATIENT AND HE IS AWARE OF THESE APPTS.

## 2022-10-02 ENCOUNTER — Telehealth: Payer: Self-pay | Admitting: *Deleted

## 2022-10-02 ENCOUNTER — Encounter: Payer: Self-pay | Admitting: Genetic Counselor

## 2022-10-02 ENCOUNTER — Ambulatory Visit: Payer: Self-pay | Admitting: Genetic Counselor

## 2022-10-02 DIAGNOSIS — Z1379 Encounter for other screening for genetic and chromosomal anomalies: Secondary | ICD-10-CM

## 2022-10-02 NOTE — Telephone Encounter (Signed)
 CALLED PATIENT TO  INFORM OF PRE-SEED APPTS. FOR 11-23-22 AND HIS IMPLANT FOR 12-14-22, SPOKE WITH PATIENT AND HE IS AWARE OF THESE APPTS.

## 2022-10-02 NOTE — Progress Notes (Signed)
HPI:   Mr. Caliendo was previously seen in the [facility] due to a personal and family history of cancer and concerns regarding a hereditary predisposition to cancer. Please refer to our prior cancer genetics clinic note for more information regarding our discussion, assessment and recommendations, at the time. Mr. Madril recent genetic test results were disclosed to him, as were recommendations warranted by these results. These results and recommendations are discussed in more detail below.  FAMILY HISTORY:  We obtained a detailed, 4-generation family history.  Significant diagnoses are listed below: Family History  Problem Relation Age of Onset   Diabetes Mother    Hypertension Mother    Prostate cancer Maternal Uncle 36 - 69   Colon cancer Paternal Uncle 96   Prostate cancer Paternal Uncle 73   Heart disease Maternal Grandmother    Heart disease Maternal Grandfather    Breast cancer Paternal Grandmother 30   Bone cancer Paternal Grandmother 14   Prostate cancer Paternal Grandfather 25   Throat cancer Paternal Grandfather 66          Mr. Bemiss paternal uncle was diagnosed with colon cancer at age 35 and prostate cancer at age 14. His prostate cancer recurred at age 35 and he is currently being treated with chemotherapy. His paternal grandmother was diagnosed with breast cancer at age 62 and bone cancer (separate primary) at age 35, she is deceased. His paternal grandfather was diagnosed with prostate cancer at age 51 and throat cancer at age 45, he is deceased. Mr. Quinto maternal uncle was diagnosed with prostate cancer in his late 109s. Mr. Landavazo is unaware of previous family history of genetic testing for hereditary cancer risks. There is no reported Ashkenazi Jewish ancestry.    GENETIC TEST RESULTS:  The [facility] Custom Panel found no pathogenic mutations.   The Custom Hereditary Cancers Panel offered by [facility] includes sequencing and/or deletion duplication  testing of the following 43 genes: APC, ATM, AXIN2, BAP1, BARD1, BMPR1A, BRCA1, BRCA2, BRIP1, CDH1, CDK4, CDKN2A (p14ARF and p16INK4a only), CHEK2, CTNNA1, EPCAM (Deletion/duplication testing only), FH, GREM1 (promoter region duplication testing only), HOXB13, KIT, MBD4, MEN1, MLH1, MSH2, MSH3, MSH6, MUTYH, NF1, NHTL1, PALB2, PDGFRA, PMS2, POLD1, POLE, PTEN, RAD51C, RAD51D, SMAD4, SMARCA4. STK11, TP53, TSC1, TSC2, and VHL.  The test report has been scanned into EPIC and is located under the Molecular Pathology section of the Results Review tab.  A portion of the result report is included below for reference. Genetic testing reported out on 09/22/2022.     Even though a pathogenic variant was not identified, possible explanations for the cancer in the family may include: There may be no hereditary risk for cancer in the family. The cancers in Mr. Trippe and/or his family may be due to other genetic or environmental factors. There may be a gene mutation in one of these genes that current testing methods cannot detect, but that chance is small. There could be another gene that has not yet been discovered, or that we have not yet tested, that is responsible for the cancer diagnoses in the family.  It is also possible there is a hereditary cause for the cancer in the family that Mr. Yazdani did not inherit.  Therefore, it is important to remain in touch with cancer genetics in the future so that we can continue to offer Mr. Cumba the most up to date genetic testing.   ADDITIONAL GENETIC TESTING:  We discussed with Mr. Vink that his genetic testing  was fairly extensive. If there are genes identified to increase cancer risk that can be analyzed in the future, we would be happy to discuss and coordinate this testing at that time.    CANCER SCREENING RECOMMENDATIONS:  Mr. Deutschman test result is considered negative (normal).  This means that we have not identified a hereditary cause for his personal and family  history of cancer at this time.   An individual's cancer risk and medical management are not determined by genetic test results alone. Overall cancer risk assessment incorporates additional factors, including personal medical history, family history, and any available genetic information that may result in a personalized plan for cancer prevention and surveillance. Therefore, it is recommended he continue to follow the cancer management and screening guidelines provided by his oncology and primary healthcare provider.  RECOMMENDATIONS FOR FAMILY MEMBERS:   Since he did not inherit a mutation in a cancer predisposition gene included on this panel, his daughter could not have inherited a mutation from him in one of these genes.  FOLLOW-UP:  Cancer genetics is a rapidly advancing field and it is possible that new genetic tests will be appropriate for him and/or his family members in the future. We encouraged him to remain in contact with cancer genetics on an annual basis so we can update his personal and family histories and let him know of advances in cancer genetics that may benefit this family.   Our contact number was provided. Mr. Wadlington questions were answered to his satisfaction, and he knows he is welcome to call us at anytime with additional questions or concerns.   Lalla Brothers, MS, [facility] Genetic Counselor [email or web address] (P) [phone]

## 2022-10-04 NOTE — Progress Notes (Signed)
RN received call from patient questioning time of arrival for procedure on 11/7.  Questions answered.   Will continue to follow up.

## 2022-11-22 ENCOUNTER — Telehealth: Payer: Self-pay | Admitting: *Deleted

## 2022-11-22 NOTE — Progress Notes (Signed)
Radiation Oncology         [phone]) [phone] ________________________________  Name: Samuel Gonzalez MRN: [id number]  Date: 11/23/2022  DOB: 10-27-1965  SIMULATION AND TREATMENT PLANNING NOTE PUBIC ARCH STUDY  NW:GNFAO, [facility], Earle Gell, MD  DIAGNOSIS: 57 y.o. gentleman with stage T1c adenocarcinoma of the prostate with a Gleason's score of 4+3 and a PSA of 4.9   Oncology History  Malignant neoplasm of prostate (HCC)  08/01/2022 Cancer Staging   Staging form: Prostate, AJCC 8th Edition - Clinical stage from 08/01/2022: Stage IIC (cT1c, cN0, cM0, PSA: 4.9, Grade Group: 3) - Signed by Marcello Fennel, PA-C on 11/22/2022 Histopathologic type: Adenocarcinoma, NOS Stage prefix: Initial diagnosis Prostate specific antigen (PSA) range: Less than 10 Gleason primary pattern: 4 Gleason secondary pattern: 3 Gleason score: 7 Histologic grading system: 5 grade system Number of biopsy cores examined: 16 Number of biopsy cores positive: 2 Location of positive needle core biopsies: Both sides   09/15/2022 Initial Diagnosis   Malignant neoplasm of prostate (HCC)       ICD-10-CM   1. Malignant neoplasm of prostate (HCC)  C61       COMPLEX SIMULATION:  The patient presented today for evaluation for possible prostate seed implant. He was brought to the radiation planning suite and placed supine on the CT couch. A 3-dimensional image study set was obtained in upload to the planning computer. There, on each axial slice, I contoured the prostate gland. Then, using three-dimensional radiation planning tools I reconstructed the prostate in view of the structures from the transperineal needle pathway to assess for possible pubic arch interference. In doing so, I did not appreciate any pubic arch interference. Also, the patient's prostate volume was estimated based on the drawn structure. The volume was 40 cc.  Given the pubic arch appearance and prostate volume, patient remains  a good candidate to proceed with prostate seed implant. Today, he freely provided informed written consent to proceed.    PLAN: The patient will undergo prostate seed implant.   ________________________________  Artist Pais. Kathrynn Running, M.D.

## 2022-11-22 NOTE — Telephone Encounter (Signed)
CALLED PATIENT TO REMIND OF PRE-SEED APPTS. FOR 11-23-22, SPOKE WITH PATIENT AND HE IS AWARE OF THESE APPTS.

## 2022-11-22 NOTE — Progress Notes (Signed)
Radiation Oncology         [phone]) [phone] ________________________________  Outpatient Follow up- Pre-seed visit  Name: Samuel Gonzalez MRN: [id number]  Date: 11/23/2022  DOB: 1965-12-25  NU:UVOZD, [facility], Earle Gell, MD   REFERRING PHYSICIAN: Crist Fat, MD  DIAGNOSIS: 57 y.o. gentleman with Stage T1c adenocarcinoma of the prostate with a Gleason's score of 4+3 and a PSA of 4.9     ICD-10-CM   1. Malignant neoplasm of prostate (HCC)  C61       HISTORY OF PRESENT ILLNESS: Samuel Gonzalez is a 57 y.o. male with a diagnosis of prostate cancer. He is an established patient with Dr. Marlou Porch at [facility] for a history of a fluctuating PSA. He was initially seen for a PSA that subsequently returned to 2.58 on re-check. He was referred back in late 2022 with a PSA of 5.82, and he has been on surveillance after his subsequent PSA dropped to 4.02. More recently, his PSA rose to 4.91 in 05/2022. This prompted a prostate MRI on 05/30/22 showing: PI-RADS 3 lesion of right posterolateral and posteromedial peripheral zone in mid gland; slight bulging of right posterior prostate near apex, potentially in associated with ROI #1. The patient proceeded to MRI fusion biopsy of the prostate on 08/01/22.  The prostate volume measured 40 cc.  Out of 16 core biopsies, 2 were positive.  The maximum Gleason score was 4+4, and this was seen in right base lateral (small focus). Additionally, Gleason 3+3 was seen in left mid lateral.      He underwent staging PSMA PET scan on 09/04/22 showing no evidence of activity outside of the right lobe of the prostate gland.     The patient reviewed the biopsy results with his urologist and was kindly referred to [place] for discussion of potential radiation treatment options. We initially met the patient on 09/15/22 and the pathology was re-reviewed and downgraded from Gleason 4+4 to Gleason 4+3 in 5% of one specimen so he was most interested  in proceeding with brachytherapy and SpaceOAR gel placement for treatment of his disease. He is here today for his pre-procedure imaging for planning and to answer any additional questions he may have about this treatment.   PREVIOUS RADIATION THERAPY: No  PAST MEDICAL HISTORY:  Past Medical History:  Diagnosis Date   Chest pain    GERD (gastroesophageal reflux disease)    Hyperlipidemia    Hypertension       PAST SURGICAL HISTORY: Past Surgical History:  Procedure Laterality Date   CARDIAC CATHETERIZATION N/A 04/20/2015   Procedure: Left Heart Cath and Coronary Angiography;  Surgeon: Yates Decamp, MD;  Location: [facility] INVASIVE CV LAB;  Service: Cardiovascular;  Laterality: N/A;   COLONOSCOPY     PROSTATE BIOPSY      FAMILY HISTORY:  Family History  Problem Relation Age of Onset   Diabetes Mother    Hypertension Mother    Prostate cancer Maternal Uncle 48 - 69   Colon cancer Paternal Uncle 40   Prostate cancer Paternal Uncle 46   Heart disease Maternal Grandmother    Heart disease Maternal Grandfather    Breast cancer Paternal Grandmother 64   Bone cancer Paternal Grandmother 85   Prostate cancer Paternal Grandfather 46   Throat cancer Paternal Grandfather 73    SOCIAL HISTORY:  Social History   Socioeconomic History   Marital status: Married    Spouse name: Not on file   Number  of children: 2   Years of education: College   Highest education level: Not on file  Occupational History   Not on file  Tobacco Use   Smoking status: Never   Smokeless tobacco: Never  Vaping Use   Vaping status: Never Used  Substance and Sexual Activity   Alcohol use: Yes    Alcohol/week: 2.0 - 3.0 standard drinks of alcohol    Types: 2 - 3 Cans of beer per week    Comment: 2-3 beers/week   Drug use: No   Sexual activity: Not on file  Other Topics Concern   Not on file  Social History Narrative   Drinks 2-3 cokes a day    Social Determinants of Health   Financial Resource  Strain: Not on file  Food Insecurity: No Food Insecurity (09/15/2022)   Hunger Vital Sign    Worried About Running Out of Food in the Last Year: Never true    Ran Out of Food in the Last Year: Never true  Transportation Needs: No Transportation Needs (09/15/2022)   PRAPARE - Administrator, Civil Service (Medical): No    Lack of Transportation (Non-Medical): No  Physical Activity: Not on file  Stress: Not on file  Social Connections: Not on file  Intimate Partner Violence: Not At Risk (09/15/2022)   Humiliation, Afraid, Rape, and Kick questionnaire    Fear of Current or Ex-Partner: No    Emotionally Abused: No    Physically Abused: No    Sexually Abused: No    ALLERGIES: Aspirin and Peanut-containing drug products  MEDICATIONS:  Current Outpatient Medications  Medication Sig Dispense Refill   cyclobenzaprine (FLEXERIL) 10 MG tablet Take 10 mg by mouth at bedtime as needed.     metoprolol succinate (TOPROL-XL) 50 MG 24 hr tablet Take 50 mg by mouth daily. Take with or immediately following a meal.     pantoprazole (PROTONIX) 40 MG tablet Take 1 tablet (40 mg total) by mouth daily. 30 tablet 0   rosuvastatin (CRESTOR) 20 MG tablet Take 20 mg by mouth daily.  5   No current facility-administered medications for this visit.    REVIEW OF SYSTEMS: On review of systems, the patient reports that he is doing well overall. He denies any chest pain, shortness of breath, cough, fevers, chills, night sweats, unintended weight changes. He denies any bowel disturbances, and denies abdominal pain, nausea or vomiting. He denies any new musculoskeletal or joint aches or pains. His IPSS was Total Score: 2, indicating mild urinary symptoms (Reference 0-7 mild, 8-19 moderate, 20-35 severe).  His SHIM: 24, indicating he does not have erectile dysfunction (Reference - 22-25 None, 17-21 Mild, 8-16 Moderate, 1-7 Severe). A complete review of systems is obtained and is otherwise negative.   PHYSICAL  EXAM:  Wt Readings from Last 3 Encounters:  09/15/22 269 lb 4 oz (122.1 kg)  05/29/22 275 lb 3.2 oz (124.8 kg)  04/10/22 273 lb (123.8 kg)   Temp Readings from Last 3 Encounters:  09/15/22 (!) 97.1 F (36.2 C) (Temporal)  03/27/17 98.1 F (36.7 C) (Oral)  04/20/15 98.5 F (36.9 C) (Oral)   BP Readings from Last 3 Encounters:  09/15/22 (!) 141/98  05/29/22 138/66  04/10/22 (!) 141/84   Pulse Readings from Last 3 Encounters:  09/15/22 62  05/29/22 60  04/10/22 68    /10  In general this is a well appearing Caucasian male in no acute distress. He's alert and oriented x4 and appropriate throughout  the examination. Cardiopulmonary assessment is negative for acute distress, and he exhibits normal effort.     KPS = 100  100 - Normal; no complaints; no evidence of disease. 90   - Able to carry on normal activity; minor signs or symptoms of disease. 80   - Normal activity with effort; some signs or symptoms of disease. 19   - Cares for self; unable to carry on normal activity or to do active work. 60   - Requires occasional assistance, but is able to care for most of his personal needs. 50   - Requires considerable assistance and frequent medical care. 40   - Disabled; requires special care and assistance. 30   - Severely disabled; hospital admission is indicated although death not imminent. 20   - Very sick; hospital admission necessary; active supportive treatment necessary. 10   - Moribund; fatal processes progressing rapidly. 0     - Dead  Karnofsky DA, Abelmann WH, Craver LS and Burchenal [facility] [phone]) The use of the nitrogen mustards in the palliative treatment of carcinoma: with particular reference to bronchogenic carcinoma Cancer 1 634-56  LABORATORY DATA:  Lab Results  Component Value Date   WBC 15.6 (H) 03/27/2017   HGB 17.8 (H) 03/27/2017   HCT 50.7 03/27/2017   MCV 87.7 03/27/2017   PLT 255 03/27/2017   Lab Results  Component Value Date   NA 139 03/27/2017   K  3.6 03/27/2017   CL 101 03/27/2017   CO2 25 03/27/2017   No results found for: "ALT", "AST", "GGT", "ALKPHOS", "BILITOT"   RADIOGRAPHY: No results found.    IMPRESSION/PLAN: 1. 57 y.o. gentleman with stage T1c adenocarcinoma of the prostate with a Gleason's score of 4+3 and a PSA of 4.9  The patient has elected to proceed with seed implant for treatment of his disease. We reviewed the risks, benefits, short and long-term effects associated with brachytherapy and discussed the role of SpaceOAR in reducing the rectal toxicity associated with radiotherapy.  He appears to have a good understanding of his disease and our treatment recommendations which are of curative intent.  He was encouraged to ask questions that were answered to his stated satisfaction. He has freely signed written consent to proceed today in the office and a copy of this document will be placed in his medical record. His procedure is tentatively scheduled for 12/14/22 in collaboration with Dr. Marlou Porch and we will see him back for his post-procedure visit approximately 3 weeks thereafter. We look forward to continuing to participate in his care. He knows that he is welcome to call with any questions or concerns at any time in the interim.  I personally spent 30 minutes in this encounter including chart review, reviewing radiological studies, meeting face-to-face with the patient, entering orders and completing documentation.    Marguarite Arbour, MMS, PA-C [place] [facility] Radiation Oncology Physician Assistant Direct Dial: [phone]  Fax: [phone]

## 2022-11-23 ENCOUNTER — Ambulatory Visit
Admission: RE | Admit: 2022-11-23 | Discharge: 2022-11-23 | Disposition: A | Discharge status: Home / Self Care | Payer: 59 | Source: Ambulatory Visit | Attending: Urology | Admitting: Urology

## 2022-11-23 ENCOUNTER — Encounter: Payer: Self-pay | Admitting: Urology

## 2022-11-23 ENCOUNTER — Ambulatory Visit
Admission: RE | Admit: 2022-11-23 | Discharge: 2022-11-23 | Disposition: A | Discharge status: Home / Self Care | Payer: 59 | Source: Ambulatory Visit | Attending: Radiation Oncology | Admitting: Radiation Oncology

## 2022-11-23 VITALS — Resp 19 | Ht 70.0 in | Wt 265.0 lb

## 2022-11-23 DIAGNOSIS — C61 Malignant neoplasm of prostate: Secondary | ICD-10-CM

## 2022-11-23 NOTE — Progress Notes (Signed)
Pre-seed nursing interview for a diagnosis of  Stage T1c adenocarcinoma of the prostate with a Gleason's score of 4+3 and a PSA of 4.9.  Patient identity verified x2.   Patient reports urinary urgency, but is doing well. Patient denies all other related issues  at this time.  Meaningful use complete.  Urinary Management medication(s)- None Urology appointment date- Pending, with Dr. Marlou Porch at [facility]  Resp 19   Ht 5\' 10"  (1.778 m)   Wt 265 lb (120.2 kg)   BMI 38.02 kg/m   This concludes the interaction.  Ruel Favors, LPN

## 2022-12-05 ENCOUNTER — Telehealth: Payer: Self-pay

## 2022-12-05 NOTE — Telephone Encounter (Signed)
Notified Patient of completion of FMLA form and Critical Illness and Cancer Claim forms. Fax transmission confirmations received. Copy of forms emailed to Patient as requested. No other needs or concerns voiced at this time.

## 2022-12-07 ENCOUNTER — Encounter (HOSPITAL_BASED_OUTPATIENT_CLINIC_OR_DEPARTMENT_OTHER): Payer: Self-pay | Admitting: Urology

## 2022-12-08 ENCOUNTER — Encounter (HOSPITAL_BASED_OUTPATIENT_CLINIC_OR_DEPARTMENT_OTHER): Payer: Self-pay | Admitting: Urology

## 2022-12-08 ENCOUNTER — Other Ambulatory Visit: Payer: Self-pay

## 2022-12-08 NOTE — Progress Notes (Addendum)
Spoke w/ via phone for pre-op interview---pt Lab needs dos----  I stat      Lab results------ COVID test -----patient states asymptomatic no test needed Arrive at -------730 12-14-2022 NPO after MN NO Solid Food.  Clear liquids from MN until---630 Med rec completed Medications to take morning of surgery -----none Diabetic medication -----n/a Patient instructed no nail polish to be worn day of surgery Patient instructed to bring photo id and insurance card day of surgery Patient aware to have Driver (ride ) / caregiver    for 24 hours after surgery - wife terri  Patient Special Instructions -----fleets enema am of surgery Pre-Op special Instructions -----bring cpap mask tubing and machine and leave in car Patient verbalized understanding of instructions that were given at this phone interview. Patient denies chest pain, sob, fever, cough at the interview.   Cardiology dr Shanon Ace 05-29-2022 f/u prn  EKG 04-10-2022 epic Echo 05-11-2022 epic Heart cath 04-20-2015 epic

## 2022-12-08 NOTE — Progress Notes (Signed)
RN attempted to reach out to patient prior to upcoming brachytherapy.  Voicemail box is full.  Will send my chart message to assess any questions prior to procedure on 11/7.

## 2022-12-13 ENCOUNTER — Telehealth: Payer: Self-pay | Admitting: *Deleted

## 2022-12-13 NOTE — Telephone Encounter (Signed)
CALLED PATIENT TO REMIND OF PROCEDURE FOR 12-14-22, SPOKE WITH PATIENT AND HE IS AWARE OF THIS PROCEDURE

## 2022-12-13 NOTE — Progress Notes (Signed)
Received call from pt to verify time of arrival for surgery @ [facility] tomorrow 12-14-2022.  Pt verbalized understanding to arrive at 0730 npo after midnight with exception clear liquids until 0630 and may take crestor, metoprolol, and protonix morning of surgery.

## 2022-12-14 ENCOUNTER — Encounter (HOSPITAL_BASED_OUTPATIENT_CLINIC_OR_DEPARTMENT_OTHER)
Admission: RE | Disposition: A | Discharge status: Home / Self Care | Payer: Self-pay | Source: Home / Self Care | Attending: Urology

## 2022-12-14 ENCOUNTER — Ambulatory Visit (HOSPITAL_BASED_OUTPATIENT_CLINIC_OR_DEPARTMENT_OTHER): Payer: 59 | Admitting: Certified Registered Nurse Anesthetist

## 2022-12-14 ENCOUNTER — Ambulatory Visit (HOSPITAL_COMMUNITY): Payer: 59

## 2022-12-14 ENCOUNTER — Encounter (HOSPITAL_BASED_OUTPATIENT_CLINIC_OR_DEPARTMENT_OTHER): Payer: Self-pay | Admitting: Urology

## 2022-12-14 ENCOUNTER — Ambulatory Visit (HOSPITAL_BASED_OUTPATIENT_CLINIC_OR_DEPARTMENT_OTHER)
Admission: RE | Admit: 2022-12-14 | Discharge: 2022-12-14 | Disposition: A | Discharge status: Home / Self Care | Payer: 59 | Attending: Urology | Admitting: Urology

## 2022-12-14 DIAGNOSIS — C61 Malignant neoplasm of prostate: Secondary | ICD-10-CM

## 2022-12-14 DIAGNOSIS — Z8042 Family history of malignant neoplasm of prostate: Secondary | ICD-10-CM | POA: Diagnosis not present

## 2022-12-14 DIAGNOSIS — G4730 Sleep apnea, unspecified: Secondary | ICD-10-CM | POA: Insufficient documentation

## 2022-12-14 DIAGNOSIS — Z01818 Encounter for other preprocedural examination: Secondary | ICD-10-CM

## 2022-12-14 DIAGNOSIS — K219 Gastro-esophageal reflux disease without esophagitis: Secondary | ICD-10-CM | POA: Diagnosis not present

## 2022-12-14 DIAGNOSIS — I10 Essential (primary) hypertension: Secondary | ICD-10-CM | POA: Insufficient documentation

## 2022-12-14 DIAGNOSIS — Z1379 Encounter for other screening for genetic and chromosomal anomalies: Secondary | ICD-10-CM

## 2022-12-14 HISTORY — PX: SPACE OAR INSTILLATION: SHX6769

## 2022-12-14 HISTORY — PX: RADIOACTIVE SEED IMPLANT: SHX5150

## 2022-12-14 HISTORY — DX: Malignant neoplasm of prostate: C61

## 2022-12-14 HISTORY — DX: Sleep apnea, unspecified: G47.30

## 2022-12-14 LAB — POCT I-STAT, CHEM 8
BUN: 17 mg/dL (ref 6–20)
Calcium, Ion: 1.25 mmol/L (ref 1.15–1.40)
Chloride: 103 mmol/L (ref 98–111)
Creatinine, Ser: 0.9 mg/dL (ref 0.61–1.24)
Glucose, Bld: 141 mg/dL — ABNORMAL HIGH (ref 70–99)
HCT: 45 % (ref 39.0–52.0)
Hemoglobin: 15.3 g/dL (ref 13.0–17.0)
Potassium: 4.1 mmol/L (ref 3.5–5.1)
Sodium: 139 mmol/L (ref 135–145)
TCO2: 24 mmol/L (ref 22–32)

## 2022-12-14 SURGERY — INSERTION, RADIATION SOURCE, PROSTATE
Anesthesia: General | Site: Prostate

## 2022-12-14 MED ORDER — MIDAZOLAM HCL 5 MG/5ML IJ SOLN
INTRAMUSCULAR | Status: DC | PRN
  Administered 2022-12-14: 2 mg via INTRAVENOUS

## 2022-12-14 MED ORDER — TRAMADOL HCL 50 MG PO TABS: 50.0000 mg | ORAL_TABLET | Freq: Four times a day (QID) | ORAL | 0 refills | Status: DC | PRN

## 2022-12-14 MED ORDER — GLYCOPYRROLATE 0.2 MG/ML IJ SOLN
INTRAMUSCULAR | Status: DC | PRN
  Administered 2022-12-14: .1 mg via INTRAVENOUS

## 2022-12-14 MED ORDER — FENTANYL CITRATE (PF) 250 MCG/5ML IJ SOLN
INTRAMUSCULAR | Status: AC
  Filled 2022-12-14: qty 5

## 2022-12-14 MED ORDER — MIDAZOLAM HCL 2 MG/2ML IJ SOLN
INTRAMUSCULAR | Status: AC
  Filled 2022-12-14: qty 2

## 2022-12-14 MED ORDER — PROPOFOL 10 MG/ML IV BOLUS
INTRAVENOUS | Status: AC
  Filled 2022-12-14: qty 20

## 2022-12-14 MED ORDER — ROCURONIUM BROMIDE 100 MG/10ML IV SOLN
INTRAVENOUS | Status: DC | PRN
  Administered 2022-12-14: 20 mg via INTRAVENOUS
  Administered 2022-12-14: 70 mg via INTRAVENOUS

## 2022-12-14 MED ORDER — IOHEXOL 300 MG/ML  SOLN
INTRAMUSCULAR | Status: DC | PRN
  Administered 2022-12-14: 7 mL

## 2022-12-14 MED ORDER — TAMSULOSIN HCL 0.4 MG PO CAPS: 0.4000 mg | ORAL_CAPSULE | Freq: Every day | ORAL | 2 refills | Status: AC

## 2022-12-14 MED ORDER — ACETAMINOPHEN 500 MG PO TABS: 1000.0000 mg | ORAL_TABLET | Freq: Once | ORAL | Status: DC

## 2022-12-14 MED ORDER — STERILE WATER FOR IRRIGATION IR SOLN
Status: DC | PRN
  Administered 2022-12-14: 3 mL via INTRAVESICAL
  Administered 2022-12-14: 60 mL

## 2022-12-14 MED ORDER — LIDOCAINE HCL (CARDIAC) PF 100 MG/5ML IV SOSY
PREFILLED_SYRINGE | INTRAVENOUS | Status: DC | PRN
  Administered 2022-12-14: 40 mg via INTRAVENOUS

## 2022-12-14 MED ORDER — SUGAMMADEX SODIUM 200 MG/2ML IV SOLN
INTRAVENOUS | Status: DC | PRN
  Administered 2022-12-14: 473.2 mg via INTRAVENOUS

## 2022-12-14 MED ORDER — CIPROFLOXACIN IN D5W 400 MG/200ML IV SOLN
400.0000 mg | INTRAVENOUS | Status: AC
  Administered 2022-12-14: 400 mg via INTRAVENOUS

## 2022-12-14 MED ORDER — SODIUM CHLORIDE 0.9 % IV SOLN: INTRAVENOUS | Status: DC

## 2022-12-14 MED ORDER — SUCCINYLCHOLINE CHLORIDE 200 MG/10ML IV SOSY
PREFILLED_SYRINGE | INTRAVENOUS | Status: DC | PRN
  Administered 2022-12-14: 140 mg via INTRAVENOUS

## 2022-12-14 MED ORDER — CIPROFLOXACIN IN D5W 400 MG/200ML IV SOLN
INTRAVENOUS | Status: AC
  Filled 2022-12-14: qty 200

## 2022-12-14 MED ORDER — PROPOFOL 10 MG/ML IV BOLUS
INTRAVENOUS | Status: DC | PRN
  Administered 2022-12-14: 160 mg via INTRAVENOUS

## 2022-12-14 MED ORDER — FLEET ENEMA RE ENEM: 1.0000 | ENEMA | Freq: Once | RECTAL | Status: DC

## 2022-12-14 MED ORDER — DEXAMETHASONE SODIUM PHOSPHATE 4 MG/ML IJ SOLN
INTRAMUSCULAR | Status: DC | PRN
  Administered 2022-12-14: 5 mg via INTRAVENOUS

## 2022-12-14 MED ORDER — LACTATED RINGERS IV SOLN: INTRAVENOUS | Status: DC

## 2022-12-14 MED ORDER — ONDANSETRON HCL 4 MG/2ML IJ SOLN
INTRAMUSCULAR | Status: DC | PRN
  Administered 2022-12-14: 4 mg via INTRAVENOUS

## 2022-12-14 MED ORDER — FENTANYL CITRATE (PF) 100 MCG/2ML IJ SOLN
INTRAMUSCULAR | Status: DC | PRN
  Administered 2022-12-14 (×3): 50 ug via INTRAVENOUS

## 2022-12-14 SURGICAL SUPPLY — 45 items
BAG DRN RND TRDRP ANRFLXCHMBR (UROLOGICAL SUPPLIES) ×2
BAG URINE DRAIN 2000ML AR STRL (UROLOGICAL SUPPLIES) ×3 IMPLANT
BLADE CLIPPER SENSICLIP SURGIC (BLADE) ×3 IMPLANT
CATH FOLEY 2WAY SLVR 5CC 16FR (CATHETERS) ×3 IMPLANT
CATH ROBINSON RED A/P 16FR (CATHETERS) IMPLANT
CATH ROBINSON RED A/P 20FR (CATHETERS) ×3 IMPLANT
CLOTH BEACON ORANGE TIMEOUT ST (SAFETY) ×3 IMPLANT
CNTNR URN SCR LID CUP LEK RST (MISCELLANEOUS) ×3 IMPLANT
CONT SPEC 4OZ STRL OR WHT (MISCELLANEOUS) ×2
COVER BACK TABLE 60X90IN (DRAPES) ×3 IMPLANT
COVER MAYO STAND STRL (DRAPES) ×3 IMPLANT
DRSG TEGADERM 4X4.75 (GAUZE/BANDAGES/DRESSINGS) ×3 IMPLANT
DRSG TEGADERM 8X12 (GAUZE/BANDAGES/DRESSINGS) ×3 IMPLANT
GAUZE SPONGE 4X4 12PLY STRL (GAUZE/BANDAGES/DRESSINGS) IMPLANT
GEL ULTRASOUND 20GR AQUASONIC (MISCELLANEOUS) ×6 IMPLANT
GLOVE BIO SURGEON STRL SZ 6.5 (GLOVE) IMPLANT
GLOVE BIO SURGEON STRL SZ7.5 (GLOVE) ×6 IMPLANT
GLOVE BIO SURGEON STRL SZ8 (GLOVE) IMPLANT
GLOVE BIOGEL PI IND STRL 6.5 (GLOVE) IMPLANT
GLOVE SURG ORTHO 8.5 STRL (GLOVE) ×3 IMPLANT
GOWN STRL REUS W/TWL XL LVL3 (GOWN DISPOSABLE) ×3 IMPLANT
GRID BRACH TEMP 18GA 2.8X3X.75 (MISCELLANEOUS) ×3 IMPLANT
HOLDER FOLEY CATH W/STRAP (MISCELLANEOUS) ×3 IMPLANT
IMPL SPACEOAR VUE SYSTEM (Spacer) ×3 IMPLANT
IMPLANT SPACEOAR VUE SYSTEM (Spacer) ×2 IMPLANT
IV NS 1000ML (IV SOLUTION) ×2
IV NS 1000ML BAXH (IV SOLUTION) ×6 IMPLANT
KIT TURNOVER CYSTO (KITS) ×3 IMPLANT
NDL BRACHY 18G 5PK (NEEDLE) ×12 IMPLANT
NDL BRACHY 18G SINGLE (NEEDLE) IMPLANT
NDL PK MORGANSTERN STABILIZ (NEEDLE) ×3 IMPLANT
NEEDLE BRACHY 18G 5PK (NEEDLE) ×8
NEEDLE BRACHY 18G SINGLE (NEEDLE)
NEEDLE PK MORGANSTERN STABILIZ (NEEDLE) ×2
PACK CYSTO (CUSTOM PROCEDURE TRAY) ×3 IMPLANT
Radioactive Seed IMPLANT
SHEATH ULTRASOUND LF (SHEATH) IMPLANT
SHEATH ULTRASOUND LTX NONSTRL (SHEATH) IMPLANT
SLEEVE SCD COMPRESS KNEE MED (STOCKING) ×3 IMPLANT
SUT BONE WAX W31G (SUTURE) IMPLANT
SYR 10ML LL (SYRINGE) ×3 IMPLANT
TOWEL OR 17X24 6PK STRL BLUE (TOWEL DISPOSABLE) ×3 IMPLANT
UNDERPAD 30X36 HEAVY ABSORB (UNDERPADS AND DIAPERS) ×6 IMPLANT
WATER STERILE IRR 3000ML UROMA (IV SOLUTION) IMPLANT
WATER STERILE IRR 500ML POUR (IV SOLUTION) ×3 IMPLANT

## 2022-12-14 NOTE — Op Note (Addendum)
Preoperative diagnosis:  Clinical stage TI C adenocarcinoma the prostate  Postoperative diagnosis:  Same  Procedure:  #1 I-125 prostate seed implantation  #2 cystourethroscopy #3 instillation of SpaceOAR biogel  Surgeon: Berniece Salines, M.D. Radiation Oncologist: Margaretmary Dys, M.D.  Anesthesia: Gen.   Indications: Patient  was diagnosed with clinical stage TI C prostate cancer. We had extensive discussion with him about treatment options versus. He elected to proceed with seed implantation. He underwent consultation my office as well as with Dr. Margaretmary Dys. He appeared to understand the advantages disadvantages potential risks of this treatment option. Full informed consent has been obtained. The patient is had preoperative ciprofloxacin. PAS compression boots were placed.   Technique and findings: Patient was brought the operating room where he had  successful induction of general anesthesia. He was placed in lithotomy position and prepped and draped in usual manner. Appropriate surgical timeout was performed. Radiation oncology department placed a transrectal ultrasound probe anchoring stand. Foley catheter with contrast in the balloon was inserted without difficulty. Anchoring needles were placed within the prostate.  Real-time contouring of the urethra prostate and rectum were performed and the dosing parameters were established. Targeted dose was 145 gray. We then came to the operating suite suite for placement of the needles. A second timeout was performed. All needle passage was done with real-time transrectal ultrasound guidance with the sagittal plane. A total of 16 needles were placed.  59 active seeds were implanted.  The brachytherapy template was then removed.   A site in the midline was selected on the perineum for placement of an 18 g needle with saline.  The needle was advanced above the rectum and below Denonvillier's fascia to the mid gland and confirmed to be in the midline  on transverse imaging.  One cc of saline was injected confirming appropriate expansion of this space.  A total of 5 cc of saline was then injected to open the space further bilaterally.  The saline syringe was then removed and the SpaceOAR hydrogel was injected with good distribution bilaterally.  A Foley catheter was removed and flexible cystoscopy failed to show any seeds outside the prostate.  The patient was brought to recovery room in stable condition.

## 2022-12-14 NOTE — Anesthesia Postprocedure Evaluation (Signed)
Anesthesia Post Note  Patient: Samuel Gonzalez  Procedure(s) Performed: RADIOACTIVE SEED IMPLANT/BRACHYTHERAPY IMPLANT (Prostate) SPACE OAR INSTILLATION (Perineum)     Patient location during evaluation: PACU Anesthesia Type: General Level of consciousness: awake and alert Pain management: pain level controlled Vital Signs Assessment: post-procedure vital signs reviewed and stable Respiratory status: spontaneous breathing, nonlabored ventilation, respiratory function stable and patient connected to nasal cannula oxygen Cardiovascular status: blood pressure returned to baseline and stable Postop Assessment: no apparent nausea or vomiting Anesthetic complications: no  No notable events documented.  Last Vitals:  Vitals:   12/14/22 1145 12/14/22 1222  BP:  121/65  Pulse:  (!) 50  Resp:  18  Temp: (P) 37.3 C 36.7 C  SpO2:  97%    Last Pain:  Vitals:   12/14/22 1222  TempSrc: Oral  PainSc: 0-No pain                 Shelton Silvas

## 2022-12-14 NOTE — Discharge Instructions (Addendum)
Radioactive Seed Implant Home Care Instructions   Activity:    Rest for the remainder of the day.  Do not drive or operate equipment today.  You may resume normal  activities in a few days as instructed by your physician, without risk of harmful radiation exposure to those around you, provided you follow the time and distance precautions on the Radiation Oncology Instruction Sheet.   Meals: Drink plenty of lipuids and eat light foods, such as gelatin or soup this evening .  You may return to normal meal plan tomorrow.  Return To Work: You may return to work as instructed by your physician.  Special Instruction:   If any seeds are found, use tweezers to pick up seeds and place in a glass container of any kind and bring to your physician's office.  Call your physician if any of these symptoms occur:  Persistent or heavy bleeding Urine stream diminishes or stops completely after catheter is removed Fever equal to or greater than 101 degrees F Cloudy urine with a strong foul odor Severe pain  You may feel some burning pain and/or hesitancy when you urinate after the catheter is removed.  These symptoms may increase over the next few weeks, but should diminish within forur to six weeks.  Applying moist heat to the lower abdomen or a hot tub bath may help relieve the pain.  If the discomfort becomes severe, please call your physician for additional medications.  PROSTATE CANCER TREATMENT WITH RADIOACTIVE IODINE-125 SEED IMPLANT  This instruction sheet is intended to discuss implantation of Iodine-125 seeds as treatment for cancer of the prostate. It will explain in detail what you may expect from this treatment and what precautions are necessary as a result of the treatment. Iodine-125 emits a relatively low energy radiation. The radioactive seeds are surgically implanted directly into the prostate gland. Most of the radiation is contained within the prostate gland. A very small amount is  present outside the body.The precautions that we ask you to take are to ensure that those around you are protected from unnecessary radiation. The principles of radiation safety that you need to understand are:  DISTANCE: The further a person is from the radioactive implant the less radiation they will be receiving. The amount of radiation received falls off quite rapidly with distance. More specific guidelines are given in the table on the last page.  TIME: The amount of radiation a person is exposed to is directly proportional to the amount of time that is spent in close proximity to the radioactive implant. Very little radiation will be received during short periods. See the table on the last page for more specific guideline.  CHILDREN UNDER AGE 18 Children should not be allowed to sit on your lap or otherwise be in very close contact for more than a few minutes for the first 6-8 weeks following the implant. You may affectionately greet (hug/kiss) a child for a short period of time, but remember, the longer you are in close proximity with that child the more radiation they are being exposed to. At a distance of 6 feet there is no limit to the length of time you may spend together. See specific guidelines on the last page.  PREGNANT OR POSSIBLY PREGNANT WOMEN Pregnant women should avoid prolonged close physical contact with you for the first 6-8 weeks after implant. At a distance of 6 feet there is no limit to the length of time you may spend together. Pregnant women or possibly pregnant   women can safely be in close contact with you for a limited period of time. See the last page for guidelines.  FAMILY RELATIONS You may sleep in the same bed as your partner (provided she is not pregnant or under the age of 45). Sexual intercourse, using a condom, may be resumed 2 weeks after the implant. Your semen may be discolored, dark brown or black. This is normal and is the result of bleeding that may have  occurred during the implant. After 3-4 weeks it will not be necessary to use a condom.  DAILY ACTIVITIES You may resume normal activities in a few days (example: work, shopping, church) without the risk of harmful radiation exposure to those around you provided you keep in mind the time and distance precautions. Objects that you touch or item that you use do not become radioactive. Linens, clothing, tableware, and dishes may be used by other persons without special precautions. Your bodily wastes (urine and stool) are not radioactive.  SPECIAL PRECAUTIONS It is possible to lose implanted Iodine-125 seed(s) through urination. Although it is possible to pass seeds indefinitely, it is most likely to occur immediately after catheter removal. To prevent this from happening the catheter that was in place during the implant procedure is removed immediately after the implant and a cystoscopy procedure is performed. The process of removing the catheter and the cystoscopy procedure should dislodge and remove any seeds that are not firmly imbedded in the prostate tissue. However, you should watch for seeds if/when you remove your catheter at home. The seeds are silver colored and the size of a grain of rice. In the unlikely event that a seed is seen after urination, simply flush the seed down the toilet. The seed should not be handled with your fingers, not even with a glove or napkin. A spoon or tweezers can be used to pick up a seed. The Radiation Oncology department is open Monday - Friday from 8:00 am to 5:30 pm with a Radiation Oncologist on call at all times. He or she may be reached by calling [phone]. If you are to be hospitalized or if death should occur, your family should notify the Radiation Safety officer.  SIDE EFFECTS There are very few side effects associate with the implant procedure. Minor burning with urination, weak stream, hesitancy, intermittency, frequency, mild pain or feeling unable to  pass your urine freely are common and usually stop in one to four months. If these symptoms are extremely uncomfortable, contact your physician.  RADIATION SAFETY GUIDELINES PROSTATE CANCER TREATMENT WITH RADIOACTIVE IODINE-125 SEED IMPLANT  The following guidelines will limit exposure to less than naturally occurring background radiation.  PERSONS AGE 18-45 (if able to become pregnant)  FOR 8 WEEKS FOLLOWING IMPLANT  At a distance of 1 foot: limit time to less than 2 hours/week At a distance of 3 feet: limit time to 20 hours/week At a distance of 6 feet: no restrictions  AFTER 8 WEEKS No restrictions  CHILDREN UNDER AGE 18, PREGNANT WOMEN OR POSSIBLY PREGNANT WOMEN  FOR 8 WEEKS FOLLOWING IMPLANT At a distance of 1 foot: limit time to 10 minutes/week At a distance of 3 feet: limit time to 2 hours/week At a distance of 6 feet: no restrictions  AFTER 8 WEEKS No restrictions  PERSONS OVER THE AGE OF 45 AND DO NOT EXPECT TO HAVE ANY MORE CHILDREN No restrictions  Updated by [facility] in January 2020      Post Anesthesia Home Care Instructions  Activity: Get   plenty of rest for the remainder of the day. A responsible individual must stay with you for 24 hours following the procedure.  For the next 24 hours, DO NOT: -Drive a car -Operate machinery -Drink alcoholic beverages -Take any medication unless instructed by your physician -Make any legal decisions or sign important papers.  Meals: Start with liquid foods such as gelatin or soup. Progress to regular foods as tolerated. Avoid greasy, spicy, heavy foods. If nausea and/or vomiting occur, drink only clear liquids until the nausea and/or vomiting subsides. Call your physician if vomiting continues.  Special Instructions/Symptoms: Your throat may feel dry or sore from the anesthesia or the breathing tube placed in your throat during surgery. If this causes discomfort, gargle with warm salt water. The discomfort should  disappear within 24 hours.  

## 2022-12-14 NOTE — H&P (Signed)
f/u to monitor Prostate Cancer  HPI: Samuel Gonzalez is a 57 year-old male established patient who is here for interval evaluation of his prostate cancer.  He was diagnosed with prostate cancer in approximately 07/31/2022. The patient's gleason score was: 4+4=8. Pretreatment PSA: 4.91.   Prostate MRI: 4/24: PIRAD 3 lesion (right posterior mid - bx benign).   Pre-treatment SHIM: 24.   The patient denies any progressive voiding symptoms.   Patient underwent a prostate biopsy on 6/24/which demonstrated Gleason 4+4 = 8 in 1 core (5%) in the right posterior lateral base.   10-year progression free survival 51%  15-year cancer specific survival rate 96%  27% chance of extracapsular extension   Today the patient is here for discussion of his new diagnosis of prostate cancer. He tolerated the prostate biopsy without difficulty. It was a fusion guided prostate biopsy. The PI-RADS 3 lesion demonstrated benign prostate, however there was 1 core of Gleason 8 in 1 core Gleason 6 prostate cancer in the standard template biopsy.     ALLERGIES: Aspirin - Hives Nuts - Hives    MEDICATIONS: Metoprolol Succinate 25 mg tablet, extended release 24 hr  Pantoprazole Sodium  Rosuvastatin Calcium 20 mg tablet  Testosterone  Tylenol Extra Strength 500 mg tablet Oral     GU PSH: Prostate Needle Biopsy - 08/01/2022       PSH Notes: 04/3015- heart cath- recommended medical management   NON-GU PSH: Colonoscopy Hernia Repair Hip Replacement, Right, 08/06/2022 Surgical Pathology, Gross And Microscopic Examination For Prostate Needle - 08/01/2022     GU PMH: Elevated PSA - 08/01/2022, - 06/27/2022, - 05/16/2022, - 11/07/2021, - 05/09/2021, - 01/11/2021, The patient does have an elevated risk of developing prostate cancer given his extensive family history. However, I suspect that this isolated PSA is spurious., - 2018 Other microscopic hematuria - 08/01/2022, Microscopic hematuria, - 2014      PMH Notes:  1898-02-06  00:00:00 - Note: Normal Routine History And Physical Adult   NON-GU PMH: Diverticulosis Hypercholesterolemia Sleep Apnea    FAMILY HISTORY: 2 daughters - Daughter Bone Cancer - Grandmother Breast Cancer - Grandmother Colon Cancer - Uncle Diabetes - Mother Heart Disease - Grandfather Hypertension - Mother kidney stone - Father Lung Cancer - Grandfather Prostate Cancer - Uncle, Grandfather   SOCIAL HISTORY: Marital Status: Single Preferred Language: English; Ethnicity: Not Hispanic Or Latino; Race: White Current Smoking Status: Patient has never smoked.   Tobacco Use Assessment Completed: Used Tobacco in last 30 days? Does drink.  Drinks 2 caffeinated drinks per day. 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     Notes: Caffeine Use, Marital History - Currently Married, Occupation:, Alcohol Use, Tobacco Use   REVIEW OF SYSTEMS:    GU Review Male:   Patient denies frequent urination, hard to postpone urination, burning/ pain with urination, get up at night to urinate, leakage of urine, stream starts and stops, trouble starting your stream, have to strain to urinate , erection problems, and penile pain.  Gastrointestinal (Upper):   Patient denies nausea, vomiting, and indigestion/ heartburn.  Gastrointestinal (Lower):   Patient denies diarrhea and constipation.  Constitutional:   Patient denies fever, night sweats, weight loss, and fatigue.  Skin:   Patient denies itching and skin rash/ lesion.  Eyes:   Patient denies blurred vision and double vision.  Ears/ Nose/ Throat:   Patient denies sore throat and sinus problems.  Hematologic/Lymphatic:   Patient denies swollen glands and easy bruising.  Cardiovascular:   Patient denies leg  swelling and chest pains.  Respiratory:   Patient denies cough and shortness of breath.  Endocrine:   Patient denies excessive thirst.  Musculoskeletal:   Patient denies back pain and joint pain.  Neurological:   Patient denies headaches and  dizziness.  Psychologic:   Patient denies depression and anxiety.   VITAL SIGNS: None   Complexity of Data:  Source Of History:  Patient  Records Review:   Pathology Reports, Previous Doctor Records, Previous Patient Records  Urine Test Review:   Urinalysis   05/09/22 10/31/21 05/02/21 01/11/21 06/02/16  PSA  Total PSA 4.91 ng/mL 4.10 ng/mL 4.02 ng/mL 5.82 ng/mL 2.58 ng/dl  Free PSA 4.69 ng/mL  6.29 ng/mL 1.80 ng/mL   % Free PSA 13 % PSA  21 % PSA 31 % PSA     PROCEDURES:          Urinalysis w/Scope Dipstick Dipstick Cont'd Micro  Color: Yellow Bilirubin: Neg mg/dL WBC/hpf: NS (Not Seen)  Appearance: Clear Ketones: Neg mg/dL RBC/hpf: 0 - 2/hpf  Specific Gravity: 1.010 Blood: 3+ ery/uL Bacteria: NS (Not Seen)  pH: 6.5 Protein: Neg mg/dL Cystals: NS (Not Seen)  Glucose: Neg mg/dL Urobilinogen: 0.2 mg/dL Casts: NS (Not Seen)    Nitrites: Neg Trichomonas: Not Present    Leukocyte Esterase: Neg leu/uL Mucous: Not Present      Epithelial Cells: NS (Not Seen)      Yeast: NS (Not Seen)      Sperm: Not Present    ASSESSMENT:      ICD-10 Details  1 GU:   Prostate Cancer - C61   2   History of prostate cancer - Z85.46      PLAN:           Orders X-Rays: PET- PSMA Scan - Gleason 8 prostate cancer, initial staging studies          Schedule         Document Letter(s):  Created for Patient: Clinical Summary         Notes:   After reviewing the patient's biopsy results and explaining to him the recommended treatment options, I have recommended, in addition, that he meet with the multidiscipline high risk prostate cancer group at [facility] center. I have made that referral. I did tell him that I thought that surgery would be his best option given his age and his high risk prostate cancer.   I have also ordered a PMSA PET CT scan to be obtained prior to his appointment.

## 2022-12-14 NOTE — Transfer of Care (Signed)
Immediate Anesthesia Transfer of Care Note  Patient: Samuel Gonzalez  Procedure(s) Performed: RADIOACTIVE SEED IMPLANT/BRACHYTHERAPY IMPLANT (Prostate) SPACE OAR INSTILLATION (Perineum)  Patient Location: PACU  Anesthesia Type:General  Level of Consciousness: awake, alert , and oriented  Airway & Oxygen Therapy: Patient Spontanous Breathing and Patient connected to face mask oxygen  Post-op Assessment: Report given to RN, Post -op Vital signs reviewed and stable, and Patient moving all extremities  Post vital signs: Reviewed and stable  Last Vitals:  Vitals Value Taken Time  BP 107/64 12/14/22 1121  Temp    Pulse 52 12/14/22 1124  Resp 11 12/14/22 1124  SpO2 100 % 12/14/22 1124  Vitals shown include unfiled device data.  Last Pain:  Vitals:   12/14/22 0738  TempSrc: Oral  PainSc: 0-No pain      Patients Stated Pain Goal: 5 (12/14/22 3875)  Complications: No notable events documented.

## 2022-12-14 NOTE — Progress Notes (Signed)
Alert and oriented, mae x 4, voices no complaints, req no assistance when ambulating, has voided x 2 prior to DC to hoem

## 2022-12-14 NOTE — Progress Notes (Signed)
Radiation Oncology         [phone] ________________________________  Name: Samuel Gonzalez MRN: [id number]  Date: 12/14/2022  DOB: 1965/05/22       Prostate Seed Implant  WU:JWJXB, [facility], Earle Gell, MD  DIAGNOSIS:  57 y.o. gentleman with stage T1c adenocarcinoma of the prostate with a Gleason's score of 4+3 and a PSA of 4.9   Oncology History  Malignant neoplasm of prostate (HCC)  08/01/2022 Cancer Staging   Staging form: Prostate, AJCC 8th Edition - Clinical stage from 08/01/2022: Stage IIC (cT1c, cN0, cM0, PSA: 4.9, Grade Group: 3) - Signed by Marcello Fennel, PA-C on 11/22/2022 Histopathologic type: Adenocarcinoma, NOS Stage prefix: Initial diagnosis Prostate specific antigen (PSA) range: Less than 10 Gleason primary pattern: 4 Gleason secondary pattern: 3 Gleason score: 7 Histologic grading system: 5 grade system Number of biopsy cores examined: 16 Number of biopsy cores positive: 2 Location of positive needle core biopsies: Both sides   09/15/2022 Initial Diagnosis   Malignant neoplasm of prostate (HCC)       ICD-10-CM   1. Pre-op testing  Z01.818 CBG per Guidelines for Diabetes Management for Patients Undergoing Surgery ([facility], [facility], and [facility] only)    CBG per protocol    I-Stat, Chem 8    CBG per Guidelines for Diabetes Management for Patients Undergoing Surgery ([facility], [facility], and [facility] only)    CBG per protocol    I-Stat, Chem 8    2. Genetic testing  Z13.79       PROCEDURE: Insertion of radioactive I-125 seeds into the prostate gland.  RADIATION DOSE: 145 Gy, definitive therapy.  TECHNIQUE: Samuel Gonzalez was brought to the operating room with the urologist. He was placed in the dorsolithotomy position. He was catheterized and a rectal tube was inserted. The perineum was shaved, prepped and draped. The ultrasound probe was then introduced by me into the rectum to see the prostate gland.  TREATMENT DEVICE: I attached the needle grid to the  ultrasound probe stand and anchor needles were placed.  3D PLANNING: The prostate was imaged in 3D using a sagittal sweep of the prostate probe. These images were transferred to the planning computer. There, the prostate, urethra and rectum were defined on each axial reconstructed image. Then, the software created an optimized 3D plan and a few seed positions were adjusted. The quality of the plan was reviewed using [facility] information for the target and the following two organs at risk:  Urethra and Rectum.  Then the accepted plan was printed and handed off to the radiation therapist.  Under my supervision, the custom loading of the seeds and spacers was carried out using the quick loader.  These pre-loaded needles were then placed into the needle holder.Marland Kitchen  PROSTATE VOLUME STUDY:  Using transrectal ultrasound the volume of the prostate was verified to be 34.9 cc.  SPECIAL TREATMENT PROCEDURE/SUPERVISION AND HANDLING: The pre-loaded needles were then delivered by the urologist under sagittal guidance. A total of 16 needles were used to deposit 59 seeds in the prostate gland. The individual seed activity was 0.469 mCi.  SpaceOAR:  Yes  COMPLEX SIMULATION: At the end of the procedure, an anterior radiograph of the pelvis was obtained to document seed positioning and count. Cystoscopy was performed by the urologist to check the urethra and bladder.  MICRODOSIMETRY: At the end of the procedure, the patient was emitting 0.06 mR/hr at 1 meter. Accordingly, he was considered safe for hospital discharge.  PLAN: The patient will return to the radiation oncology clinic for post implant CT dosimetry in three weeks.   ________________________________  Artist Pais Kathrynn Running, M.D.

## 2022-12-14 NOTE — Interval H&P Note (Signed)
History and Physical Interval Note: NAD RRR CTA-B  Ok to proceed.  12/14/2022 8:12 AM  Samuel Gonzalez  has presented today for surgery, with the diagnosis of PROSTATE CANCER.  The various methods of treatment have been discussed with the patient and family. After consideration of risks, benefits and other options for treatment, the patient has consented to  Procedure(s): RADIOACTIVE SEED IMPLANT/BRACHYTHERAPY IMPLANT (N/A) SPACE OAR INSTILLATION (N/A) as a surgical intervention.  The patient's history has been reviewed, patient examined, no change in status, stable for surgery.  I have reviewed the patient's chart and labs.  Questions were answered to the patient's satisfaction.     Crist Fat

## 2022-12-14 NOTE — Anesthesia Preprocedure Evaluation (Signed)
Anesthesia Evaluation  Patient identified by MRN, date of birth, ID band Patient awake    Reviewed: Allergy & Precautions, NPO status , Patient's Chart, lab work & pertinent test results  Airway Mallampati: II  TM Distance: <3 FB Neck ROM: Full    Dental  (+) Teeth Intact, Dental Advisory Given   Pulmonary sleep apnea    breath sounds clear to auscultation       Cardiovascular hypertension, Pt. on home beta blockers  Rhythm:Regular Rate:Normal     Neuro/Psych negative neurological ROS  negative psych ROS   GI/Hepatic Neg liver ROS,GERD  Medicated,,  Endo/Other  negative endocrine ROS    Renal/GU negative Renal ROS     Musculoskeletal negative musculoskeletal ROS (+)    Abdominal   Peds  Hematology negative hematology ROS (+)   Anesthesia Other Findings   Reproductive/Obstetrics                             Anesthesia Physical Anesthesia Plan  ASA: 2  Anesthesia Plan: General   Post-op Pain Management: Tylenol PO (pre-op)* and Toradol IV (intra-op)*   Induction: Intravenous  PONV Risk Score and Plan: 3 and Ondansetron, Dexamethasone and Midazolam  Airway Management Planned: Oral ETT  Additional Equipment: None  Intra-op Plan:   Post-operative Plan: Extubation in OR  Informed Consent: I have reviewed the patients History and Physical, chart, labs and discussed the procedure including the risks, benefits and alternatives for the proposed anesthesia with the patient or authorized representative who has indicated his/her understanding and acceptance.     Dental advisory given  Plan Discussed with: CRNA  Anesthesia Plan Comments:        Anesthesia Quick Evaluation

## 2022-12-18 ENCOUNTER — Encounter (HOSPITAL_BASED_OUTPATIENT_CLINIC_OR_DEPARTMENT_OTHER): Payer: Self-pay | Admitting: Urology

## 2023-01-02 ENCOUNTER — Telehealth: Payer: Self-pay | Admitting: *Deleted

## 2023-01-02 NOTE — Telephone Encounter (Signed)
CALLED PATIENT TO REMIND OF POST SEED APPTS. FOR 01-03-23, LVM FOR A RETURN CALL

## 2023-01-03 ENCOUNTER — Encounter: Payer: Self-pay | Admitting: Urology

## 2023-01-03 ENCOUNTER — Ambulatory Visit
Admission: RE | Admit: 2023-01-03 | Discharge: 2023-01-03 | Discharge status: Home / Self Care | Payer: 59 | Source: Ambulatory Visit | Attending: Urology | Admitting: Urology

## 2023-01-03 ENCOUNTER — Ambulatory Visit
Admission: RE | Admit: 2023-01-03 | Discharge: 2023-01-03 | Disposition: A | Discharge status: Home / Self Care | Payer: 59 | Source: Ambulatory Visit | Attending: Urology | Admitting: Urology

## 2023-01-03 VITALS — BP 116/74 | HR 59 | Temp 97.5°F | Resp 18 | Ht 70.0 in | Wt 272.1 lb

## 2023-01-03 DIAGNOSIS — C61 Malignant neoplasm of prostate: Secondary | ICD-10-CM | POA: Insufficient documentation

## 2023-01-03 DIAGNOSIS — Z923 Personal history of irradiation: Secondary | ICD-10-CM | POA: Insufficient documentation

## 2023-01-03 DIAGNOSIS — Z79899 Other long term (current) drug therapy: Secondary | ICD-10-CM | POA: Insufficient documentation

## 2023-01-03 NOTE — Progress Notes (Signed)
Radiation Oncology         [phone] ________________________________  Name: Samuel Gonzalez MRN: [id number]  Date: 01/03/2023  DOB: 08/30/1965  Post-Seed Follow-Up Visit Note  CC: [facility], Earle Gell, MD  Diagnosis:    57 y.o. gentleman with Stage T1c adenocarcinoma of the prostate with a Gleason's score of 4+3 and a PSA of 4.9     ICD-10-CM   1. Malignant neoplasm of prostate (HCC)  C61       Interval Since Last Radiation:  3 weeks 12/14/22:  Insertion of radioactive I-125 seeds into the prostate gland; 145 Gy, definitive therapy with placement of SpaceOAR gel.  Narrative:  The patient returns today for routine follow-up.  He is complaining of increased urinary frequency and urinary hesitation symptoms. He filled out a questionnaire regarding urinary function today providing and overall IPSS score of 6 characterizing his symptoms as mild with mostly daytime frequency. He has occasional dysuria at the start of his stream, particularly when he drinks caffeine or orange juice.  His pre-implant score was 2. He denies any abdominal pain or bowel symptoms. He has noted modest fatigue/decreased stamina but feels like this is gradually improving and is overall, pleased with his progress to date.  ALLERGIES:  is allergic to aspirin and peanut-containing drug products.  Meds: Current Outpatient Medications  Medication Sig Dispense Refill   cyclobenzaprine (FLEXERIL) 10 MG tablet Take 10 mg by mouth at bedtime as needed.     metoprolol succinate (TOPROL-XL) 50 MG 24 hr tablet Take 50 mg by mouth daily. Take with or immediately following a meal.     pantoprazole (PROTONIX) 40 MG tablet Take 1 tablet (40 mg total) by mouth daily. 30 tablet 0   rosuvastatin (CRESTOR) 20 MG tablet Take 20 mg by mouth daily.  5   tamsulosin (FLOMAX) 0.4 MG CAPS capsule Take 1 capsule (0.4 mg total) by mouth daily. 30 capsule 2   traMADol (ULTRAM) 50 MG tablet Take 1-2 tablets  (50-100 mg total) by mouth every 6 (six) hours as needed for moderate pain (pain score 4-6). 15 tablet 0   No current facility-administered medications for this visit.    Physical Findings: In general this is a well appearing [place] male in no acute distress. He's alert and oriented x4 and appropriate throughout the examination. Cardiopulmonary assessment is negative for acute distress and he exhibits normal effort.   Lab Findings: Lab Results  Component Value Date   WBC 15.6 (H) 03/27/2017   HGB 15.3 12/14/2022   HCT 45.0 12/14/2022   MCV 87.7 03/27/2017   PLT 255 03/27/2017    Radiographic Findings:  Patient underwent CT imaging in our clinic for post implant dosimetry. The CT will be reviewed by Dr. Kathrynn Running to confirm there is an adequate distribution of radioactive seeds throughout the prostate gland and ensure that there are no seeds in or near the rectum.  We suspect the final radiation plan and dosimetry will show appropriate coverage of the prostate gland. He understands that we will call and inform him of any unexpected findings on further review of his imaging and dosimetry.  Impression/Plan:  57 y.o. gentleman with Stage T1c adenocarcinoma of the prostate with a Gleason's score of 4+3 and a PSA of 4.9  The patient is recovering from the effects of radiation. His urinary symptoms should gradually improve over the next 4-6 months. We talked about this today. He is encouraged by his improvement already and  is otherwise pleased with his outcome. We also talked about long-term follow-up for prostate cancer following seed implant. He understands that ongoing PSA determinations and digital rectal exams will help perform surveillance to rule out disease recurrence. He had a follow up appointment scheduled with Bartholomew Crews, NP earlier today and will see Dr. Marlou Porch in 03/2023 for his first post-treatment PSA. He understands what to expect with his PSA measures. Patient was also educated  today about some of the long-term effects from radiation including a small risk for rectal bleeding and possibly erectile dysfunction. We talked about some of the general management approaches to these potential complications. However, I did encourage the patient to contact our office or return at any point if he has questions or concerns related to his previous radiation and prostate cancer.    Marguarite Arbour, PA-C

## 2023-01-03 NOTE — Progress Notes (Signed)
Post-seed nursing interview for a diagnosis of  57 y.o. gentleman with Stage T1c adenocarcinoma of the prostate with a Gleason's score of 4+3 and a PSA of 4.9.  Patient identity verified x2.   Patient reports fatigue, dysuria 3/10, loose stools. Patient denies all other related issues at this time.  Meaningful use complete.  I-PSS score- 6 - Mild SHIM score- 25 Urinary Management medication(s) Tamsulosin Urology appointment date- 03/2023 with Dr. Marlou Porch at [facility]  Vitals- BP 116/74 (BP Location: Left Arm, Patient Position: Sitting, Cuff Size: Large)   Pulse (!) 59   Temp (!) 97.5 F (36.4 C) (Temporal)   Resp 18   Ht 5\' 10"  (1.778 m)   Wt 272 lb 2 oz (123.4 kg)   SpO2 98%   BMI 39.05 kg/m

## 2023-01-03 NOTE — Progress Notes (Signed)
  Radiation Oncology         [phone] ________________________________  Name: Samuel Gonzalez MRN: [id number]  Date: 01/03/2023  DOB: 07-08-1965  COMPLEX SIMULATION NOTE  NARRATIVE:  The patient was brought to the CT Simulation planning suite today following prostate seed implantation approximately one month ago.  Identity was confirmed.  All relevant records and images related to the planned course of therapy were reviewed.  Then, the patient was set-up supine.  CT images were obtained.  The CT images were loaded into the planning software.  Then the prostate and rectum were contoured.  Treatment planning then occurred.  The implanted iodine 125 seeds were identified by the physics staff for projection of radiation distribution  I have requested : 3D Simulation  I have requested a DVH of the following structures: Prostate and rectum.    ________________________________  Artist Pais Kathrynn Running, M.D.

## 2023-01-11 ENCOUNTER — Encounter: Payer: Self-pay | Admitting: Radiation Oncology

## 2023-01-11 ENCOUNTER — Ambulatory Visit
Admission: RE | Admit: 2023-01-11 | Discharge: 2023-01-11 | Disposition: A | Discharge status: Home / Self Care | Payer: 59 | Source: Ambulatory Visit | Attending: Radiation Oncology | Admitting: Radiation Oncology

## 2023-01-11 DIAGNOSIS — C61 Malignant neoplasm of prostate: Secondary | ICD-10-CM | POA: Insufficient documentation

## 2023-01-11 NOTE — Radiation Completion Notes (Addendum)
  Radiation Oncology         [phone] ________________________________  Name: Samuel Gonzalez MRN: [id number]  Date: 01/03/2023  DOB: [birth year]  Referring Physician: BENJAMIN HERRICK, M.D. Date of Service: 2023-01-11 Radiation Oncologist: Adina Barge, M.D. [facility]     RADIATION ONCOLOGY END OF TREATMENT NOTE     Diagnosis:  57 y.o. gentleman with Stage T1c adenocarcinoma of the prostate with a Gleason's score of 4+3 and a PSA of 4.9   Intent: Curative     ==========DELIVERED PLANS==========  Prostate Seed Implant Date: 2022-12-14   Plan Name: Prostate Seed Implant Site: Prostate Technique: Radioactive Seed Implant I-125 Mode: Brachytherapy Dose Per Fraction: 145 Gy Prescribed Dose (Delivered / Prescribed): 145 Gy / 145 Gy Prescribed Fxs (Delivered / Prescribed): 1 / 1     ==========ON TREATMENT VISIT DATES========== 2022-12-14    The patient will receive a call in about one month from the radiation oncology department. He will continue follow up with Dr. Cam as well.  ------------------------------------------------   Donnice Barge, MD [facility]  Radiation Oncology Direct Dial: [phone]  Fax: [phone] [email or web address]  Skype  [email or web address]

## 2023-01-12 NOTE — Progress Notes (Signed)
  Radiation Oncology         [phone] ________________________________  Name: Samuel Gonzalez MRN: [id number]  Date: 01/11/2023  DOB: 1965/06/01  3D Planning Note   Prostate Brachytherapy Post-Implant Dosimetry  Diagnosis: 57 y.o. gentleman with stage T1c adenocarcinoma of the prostate with a Gleason's score of 4+3 and a PSA of 4.9   Narrative: On a previous date, Samuel Gonzalez returned following prostate seed implantation for post implant planning. He underwent CT scan complex simulation to delineate the three-dimensional structures of the pelvis and demonstrate the radiation distribution.  Since that time, the seed localization, and complex isodose planning with dose volume histograms have now been completed.  Results:   Prostate Coverage - The dose of radiation delivered to the 90% or more of the prostate gland (D90) was 113.38% of the prescription dose. This exceeds our goal of greater than 90%. Rectal Sparing - The volume of rectal tissue receiving the prescription dose or higher was 0.0 cc. This falls under our thresholds tolerance of 1.0 cc.  Impression: The prostate seed implant appears to show adequate target coverage and appropriate rectal sparing.  Plan:  The patient will continue to follow with urology for ongoing PSA determinations. I would anticipate a high likelihood for local tumor control with minimal risk for rectal morbidity.  ________________________________  Artist Pais Kathrynn Running, M.D.

## 2023-01-19 ENCOUNTER — Encounter: Payer: Self-pay | Admitting: *Deleted

## 2023-02-19 ENCOUNTER — Inpatient Hospital Stay: Payer: 59 | Attending: Adult Health | Admitting: *Deleted

## 2023-02-19 ENCOUNTER — Encounter: Payer: Self-pay | Admitting: *Deleted

## 2023-02-19 DIAGNOSIS — C61 Malignant neoplasm of prostate: Secondary | ICD-10-CM

## 2023-02-19 NOTE — Progress Notes (Signed)
 SCP reviewed and completed. Pt will have post-treatment PSA labs on 03/26/2023 and appt w/ Dr. Marlou Porch on 04/02/2023.Vaccines UTD.

## 2023-09-28 NOTE — Addendum Note (Signed)
 Encounter addended by: Trinisha Paget, PA-C on: 09/28/2023 1:44 PM  Actions taken: Clinical Note Signed
